# Patient Record
Sex: Female | Born: 1935 | Race: White | Hispanic: No | Marital: Married | Smoking: Never smoker
Health system: Southern US, Community
[De-identification: ages and names within clinical notes are randomized; demographics above are authoritative.]

## PROBLEM LIST (undated history)

## (undated) DIAGNOSIS — T17308A Unspecified foreign body in larynx causing other injury, initial encounter: Secondary | ICD-10-CM

## (undated) DIAGNOSIS — F319 Bipolar disorder, unspecified: Secondary | ICD-10-CM

## (undated) DIAGNOSIS — E669 Obesity, unspecified: Secondary | ICD-10-CM

## (undated) DIAGNOSIS — L0390 Cellulitis, unspecified: Secondary | ICD-10-CM

## (undated) DIAGNOSIS — R911 Solitary pulmonary nodule: Secondary | ICD-10-CM

## (undated) DIAGNOSIS — K219 Gastro-esophageal reflux disease without esophagitis: Secondary | ICD-10-CM

## (undated) DIAGNOSIS — E039 Hypothyroidism, unspecified: Secondary | ICD-10-CM

## (undated) DIAGNOSIS — F329 Major depressive disorder, single episode, unspecified: Secondary | ICD-10-CM

## (undated) DIAGNOSIS — M21962 Unspecified acquired deformity of left lower leg: Secondary | ICD-10-CM

## (undated) DIAGNOSIS — R251 Tremor, unspecified: Secondary | ICD-10-CM

## (undated) DIAGNOSIS — D649 Anemia, unspecified: Secondary | ICD-10-CM

## (undated) DIAGNOSIS — M21961 Unspecified acquired deformity of right lower leg: Secondary | ICD-10-CM

## (undated) DIAGNOSIS — I639 Cerebral infarction, unspecified: Secondary | ICD-10-CM

## (undated) DIAGNOSIS — R413 Other amnesia: Secondary | ICD-10-CM

## (undated) DIAGNOSIS — I890 Lymphedema, not elsewhere classified: Secondary | ICD-10-CM

## (undated) DIAGNOSIS — R41841 Cognitive communication deficit: Secondary | ICD-10-CM

## (undated) HISTORY — DX: Anemia, unspecified: D64.9

## (undated) HISTORY — DX: Tremor, unspecified: R25.1

## (undated) HISTORY — DX: Gastro-esophageal reflux disease without esophagitis: K21.9

## (undated) HISTORY — DX: Major depressive disorder, single episode, unspecified: F32.9

## (undated) HISTORY — PX: OTHER SURGICAL HISTORY: SHX169

## (undated) HISTORY — DX: Cellulitis, unspecified: L03.90

## (undated) HISTORY — DX: Cognitive communication deficit: R41.841

## (undated) HISTORY — DX: Obesity, unspecified: E66.9

## (undated) HISTORY — DX: Unspecified acquired deformity of right lower leg: M21.961

## (undated) HISTORY — DX: Other amnesia: R41.3

## (undated) HISTORY — DX: Hypothyroidism, unspecified: E03.9

## (undated) HISTORY — DX: Unspecified acquired deformity of left lower leg: M21.962

## (undated) HISTORY — DX: Cerebral infarction, unspecified: I63.9

## (undated) HISTORY — DX: Bipolar disorder, unspecified: F31.9

## (undated) HISTORY — DX: Solitary pulmonary nodule: R91.1

## (undated) HISTORY — DX: Lymphedema, not elsewhere classified: I89.0

---

## 2004-12-29 ENCOUNTER — Inpatient Hospital Stay (HOSPITAL_COMMUNITY): Admission: EM | Admit: 2004-12-29 | Discharge: 2004-12-31 | Payer: Self-pay | Admitting: Emergency Medicine

## 2005-05-19 ENCOUNTER — Emergency Department (HOSPITAL_COMMUNITY): Admission: EM | Admit: 2005-05-19 | Discharge: 2005-05-19 | Payer: Self-pay | Admitting: *Deleted

## 2005-05-26 ENCOUNTER — Emergency Department (HOSPITAL_COMMUNITY): Admission: EM | Admit: 2005-05-26 | Discharge: 2005-05-26 | Payer: Self-pay | Admitting: Emergency Medicine

## 2006-02-07 ENCOUNTER — Encounter (INDEPENDENT_AMBULATORY_CARE_PROVIDER_SITE_OTHER): Payer: Self-pay | Admitting: *Deleted

## 2006-02-07 ENCOUNTER — Ambulatory Visit (HOSPITAL_COMMUNITY): Admission: RE | Admit: 2006-02-07 | Discharge: 2006-02-07 | Payer: Self-pay | Admitting: Family Medicine

## 2006-07-19 ENCOUNTER — Ambulatory Visit: Payer: Self-pay | Admitting: Vascular Surgery

## 2006-11-24 ENCOUNTER — Emergency Department (HOSPITAL_COMMUNITY): Admission: EM | Admit: 2006-11-24 | Discharge: 2006-11-24 | Payer: Self-pay | Admitting: Emergency Medicine

## 2006-11-25 ENCOUNTER — Ambulatory Visit: Payer: Self-pay | Admitting: Internal Medicine

## 2006-11-25 ENCOUNTER — Ambulatory Visit: Payer: Self-pay | Admitting: Cardiovascular Disease

## 2006-11-25 ENCOUNTER — Inpatient Hospital Stay (HOSPITAL_COMMUNITY): Admission: EM | Admit: 2006-11-25 | Discharge: 2006-11-29 | Payer: Self-pay | Admitting: Emergency Medicine

## 2006-11-27 ENCOUNTER — Encounter (INDEPENDENT_AMBULATORY_CARE_PROVIDER_SITE_OTHER): Payer: Self-pay | Admitting: *Deleted

## 2006-11-29 ENCOUNTER — Encounter: Payer: Self-pay | Admitting: *Deleted

## 2006-12-16 ENCOUNTER — Ambulatory Visit: Payer: Self-pay | Admitting: Internal Medicine

## 2007-02-12 ENCOUNTER — Encounter: Admission: RE | Admit: 2007-02-12 | Discharge: 2007-03-12 | Payer: Self-pay | Admitting: Family Medicine

## 2007-03-13 ENCOUNTER — Encounter: Admission: RE | Admit: 2007-03-13 | Discharge: 2007-04-10 | Payer: Self-pay | Admitting: Family Medicine

## 2007-04-03 DIAGNOSIS — K219 Gastro-esophageal reflux disease without esophagitis: Secondary | ICD-10-CM

## 2007-04-04 ENCOUNTER — Ambulatory Visit: Payer: Self-pay | Admitting: Internal Medicine

## 2007-04-04 DIAGNOSIS — J984 Other disorders of lung: Secondary | ICD-10-CM | POA: Insufficient documentation

## 2007-04-15 ENCOUNTER — Telehealth (INDEPENDENT_AMBULATORY_CARE_PROVIDER_SITE_OTHER): Payer: Self-pay | Admitting: *Deleted

## 2007-07-13 ENCOUNTER — Emergency Department (HOSPITAL_COMMUNITY): Admission: EM | Admit: 2007-07-13 | Discharge: 2007-07-13 | Payer: Self-pay | Admitting: Emergency Medicine

## 2008-01-21 ENCOUNTER — Encounter (INDEPENDENT_AMBULATORY_CARE_PROVIDER_SITE_OTHER): Payer: Self-pay | Admitting: Obstetrics and Gynecology

## 2008-01-21 ENCOUNTER — Ambulatory Visit (HOSPITAL_COMMUNITY): Admission: RE | Admit: 2008-01-21 | Discharge: 2008-01-21 | Payer: Self-pay | Admitting: Obstetrics and Gynecology

## 2008-05-17 ENCOUNTER — Ambulatory Visit: Payer: Self-pay | Admitting: Family Medicine

## 2008-05-17 DIAGNOSIS — R32 Unspecified urinary incontinence: Secondary | ICD-10-CM | POA: Insufficient documentation

## 2008-06-04 ENCOUNTER — Ambulatory Visit: Payer: Self-pay | Admitting: Family Medicine

## 2008-06-04 DIAGNOSIS — I87309 Chronic venous hypertension (idiopathic) without complications of unspecified lower extremity: Secondary | ICD-10-CM | POA: Insufficient documentation

## 2008-06-25 ENCOUNTER — Ambulatory Visit: Payer: Self-pay | Admitting: Family Medicine

## 2008-07-19 ENCOUNTER — Ambulatory Visit: Payer: Self-pay | Admitting: Vascular Surgery

## 2008-08-23 IMAGING — CT NM PET TUM IMG SKULL BASE T - THIGH
6 series · 25 of 25 positions shown · IV contrast ([ID])
Comparison: CT dated 11/25/2006

CLINICAL DATA: Lung mass

FDG PET-CT TUMOR IMAGING (SKULL BASE TO THIGHS):
Fasting Blood Glucose:  114
TECHNIQUE: 15.7 mCi F-18 FDG was injected intravenously via the right wrist . 
Full-ring PET imaging was performed from the skull base through the mid-thighs
64 minutes after injection.  CT data was obtained and used for attenuation
correction and anatomic localization only.  (This was not acquired as a
diagnostic CT examination.)

[Series 1: pet ac · axial · 3.3mm · 4.69mm/px · z∈[-870,+0]mm · 5 of 267 slices shown]
[im 1/267]
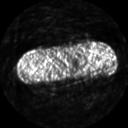
[im 67/267]
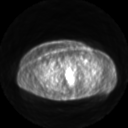
[im 134/267]
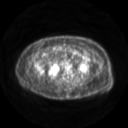
[im 200/267]
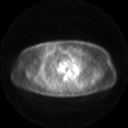
[im 267/267]
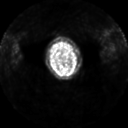

[Series 2: pet nac · axial · 3.3mm · 4.69mm/px · z∈[-870,+0]mm · 6 of 267 slices shown]
[im 1/267]
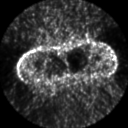
[im 54/267]
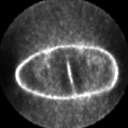
[im 107/267]
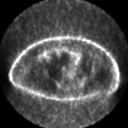
[im 160/267]
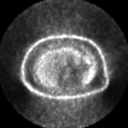
[im 213/267]
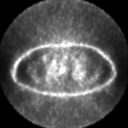
[im 267/267]
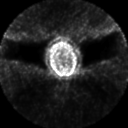

[Series 2: ct images · axial · 3.8mm · 0.98mm/px · z∈[-870,+0]mm · 6 of 263 slices shown]
[im 1/263]
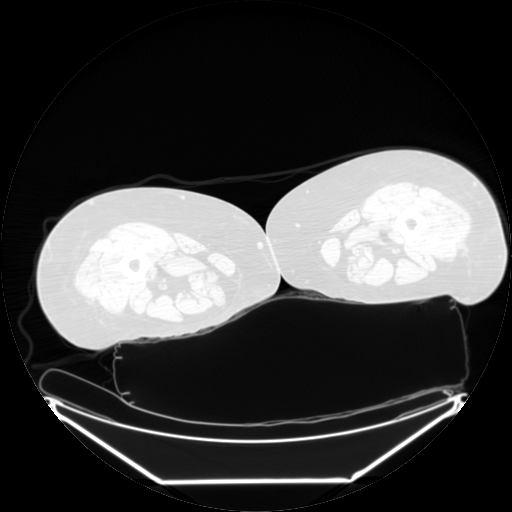
[im 53/263]
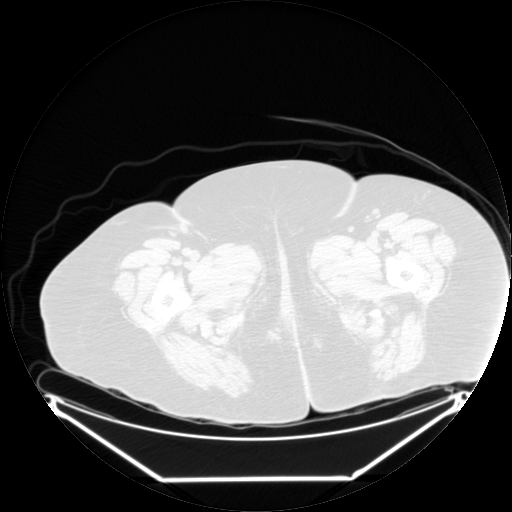
[im 105/263]
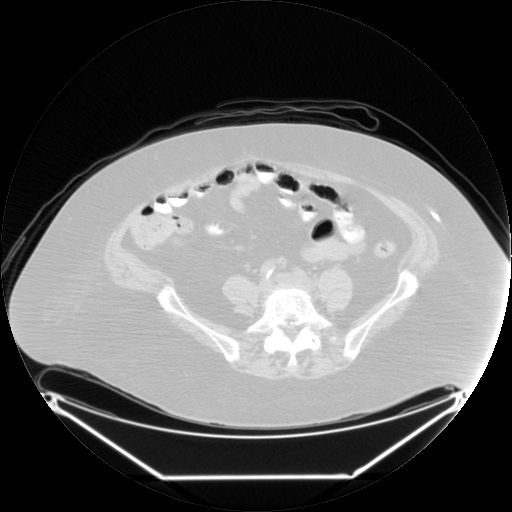
[im 158/263]
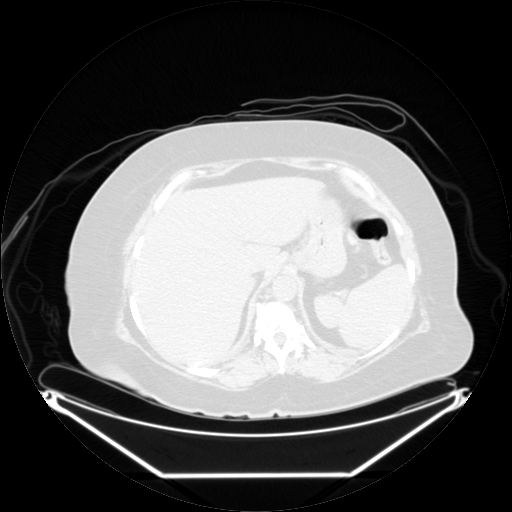
[im 210/263]
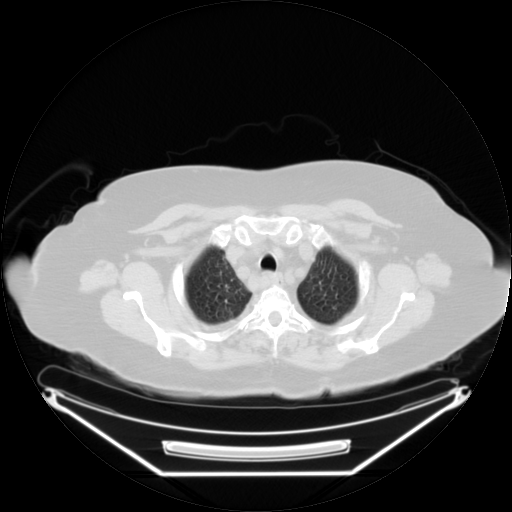
[im 263/263  brain]
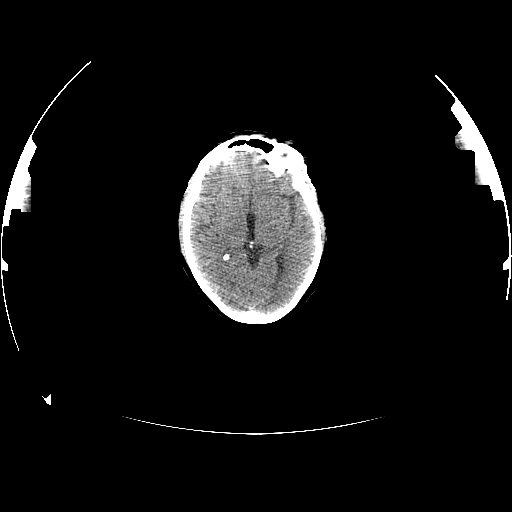

[Series 123: mip · coronal · 3.3mm · 4.69mm/px · 1 of 30 slices shown]
[im 1/30]
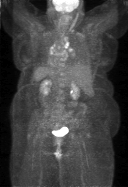

[Series 151: reformatted · axial · 3.3mm · 3.91mm/px · z∈[-870,+0]mm · 6 of 265 slices shown (1 of 2)]
[im 1/265]
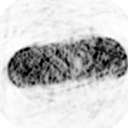
[im 53/265]
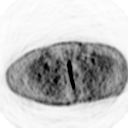
[im 106/265]
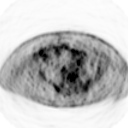
[im 159/265]
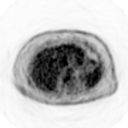
[im 212/265]
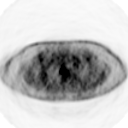
[im 265/265]
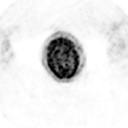

[Series 153: reformatted · coronal · 4.7mm · 6.98mm/px · 1 of 69 slices shown (2 of 2)]
[im 1/69]
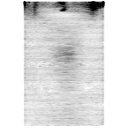

[25 of 25 positions shown; findings below may reference images not displayed]

FINDINGS: There is no hypermetabolic lymph nodes within the soft tissues of the
neck.

Negative for hypermetabolic axillary, lymph nodes.

Right supraclavicular lymph node measures 10 mm and exhibits increased uptake
with an SUV max equal to 5.6, image 44.

Enlarged hypermetabolic bilateral hilar and mediastinal lymph nodes are
identified.  Many of these lymph nodes are partially calcified may reflect prior
granulomatous inflammation or infection.  For reference purposes left hilar
lymph node has an SUV max equal to 9.1.

Right paratracheal lymph node has an SUV max equaled 14.0.

Bilateral pleural effusions, right greater than left.

Increased interstitial markings are noted consistent with a pulmonary edema.

Within the right middle lobe there is a spiculated nodule which measures 8.9 mm.
This is too small to reliably characterized by PET. The SUV max within this
nodule measures 1.5. This degree of uptake is considered nonspecific for tumor.
However, given the small size of this nodule and ischia the max of 1.5 is
somewhat concerning for malignancy. 

There is no abnormal FDG uptake within the liver parenchyma.

Gallstones are identified.

Both adrenal glands are negative.

Pancreas is negative.

There is no abnormal uptake within the kidneys.

There is no hypermetabolic retroperitoneal, or small bowel mesenteric lymph
nodes.

No hypermetabolic pelvic, or inguinal lymph nodes. 

Review of the visualized axial and appendicular structures shows no evidence for
hypermetabolic bone metastases.
IMPRESSION: 1. Again seen is a spiculated nodule within the right lower lobe. By transaxial
imaging this measures 8.9 mm which, is too small to reliably characterized by
PET. Mild low-level nonspecific uptake within this nodule is noted however. 
This nodule should be managed in one of 2 ways: 1.Proceed with percutaneous
biopsy. 2.  Alternatively,  a followup examination and 3 months should be
obtained.
2.  Enlarged mediastinal and hilar lymph nodes exhibit intense FDG uptake. Some
of these lymph nodes are partially calcified which may reflect prior
granulomatous inflammation or infection. Granulomatous disease may exhibit
abnormal increased uptake on PET imaging. Given the nonspecific results of this
exam, percutaneous biopsy of these lymph nodes would be the most specific way to
evaluate for the presence of malignancy.
3. No evidence for hypermetabolic tumor within the abdomen or pelvis.

## 2008-09-01 ENCOUNTER — Emergency Department (HOSPITAL_COMMUNITY): Admission: EM | Admit: 2008-09-01 | Discharge: 2008-09-01 | Payer: Self-pay | Admitting: Emergency Medicine

## 2009-11-10 ENCOUNTER — Ambulatory Visit: Payer: Self-pay | Admitting: Family Medicine

## 2009-11-10 DIAGNOSIS — J309 Allergic rhinitis, unspecified: Secondary | ICD-10-CM | POA: Insufficient documentation

## 2009-11-10 DIAGNOSIS — R3915 Urgency of urination: Secondary | ICD-10-CM | POA: Insufficient documentation

## 2009-11-10 LAB — CONVERTED CEMR LAB: Blood Glucose, Fingerstick: 117

## 2009-12-07 ENCOUNTER — Telehealth: Payer: Self-pay | Admitting: Family Medicine

## 2009-12-14 ENCOUNTER — Telehealth: Payer: Self-pay | Admitting: Family Medicine

## 2009-12-16 ENCOUNTER — Ambulatory Visit: Payer: Self-pay | Admitting: Family Medicine

## 2009-12-16 DIAGNOSIS — M25579 Pain in unspecified ankle and joints of unspecified foot: Secondary | ICD-10-CM | POA: Insufficient documentation

## 2009-12-19 ENCOUNTER — Telehealth (INDEPENDENT_AMBULATORY_CARE_PROVIDER_SITE_OTHER): Payer: Self-pay | Admitting: *Deleted

## 2009-12-19 ENCOUNTER — Ambulatory Visit: Payer: Self-pay | Admitting: Family Medicine

## 2009-12-19 LAB — CONVERTED CEMR LAB
Basophils Absolute: 0.1 10*3/uL (ref 0.0–0.1)
Basophils Relative: 0.8 % (ref 0.0–3.0)
Eosinophils Absolute: 0.3 10*3/uL (ref 0.0–0.7)
Eosinophils Relative: 4 % (ref 0.0–5.0)
HCT: 31.4 % — ABNORMAL LOW (ref 36.0–46.0)
Hemoglobin: 10.2 g/dL — ABNORMAL LOW (ref 12.0–15.0)
Lymphocytes Relative: 19.4 % (ref 12.0–46.0)
Lymphs Abs: 1.5 10*3/uL (ref 0.7–4.0)
MCHC: 32.7 g/dL (ref 30.0–36.0)
MCV: 88.8 fL (ref 78.0–100.0)
Monocytes Absolute: 0.6 10*3/uL (ref 0.1–1.0)
Monocytes Relative: 7.1 % (ref 3.0–12.0)
Neutro Abs: 5.4 10*3/uL (ref 1.4–7.7)
Neutrophils Relative %: 68.7 % (ref 43.0–77.0)
Platelets: 299 10*3/uL (ref 150.0–400.0)
RBC: 3.53 M/uL — ABNORMAL LOW (ref 3.87–5.11)
RDW: 14.6 % (ref 11.5–14.6)
Sed Rate: 61 mm/hr — ABNORMAL HIGH (ref 0–22)
Uric Acid, Serum: 8.8 mg/dL — ABNORMAL HIGH (ref 2.4–7.0)
WBC: 7.8 10*3/uL (ref 4.5–10.5)

## 2010-01-06 ENCOUNTER — Ambulatory Visit: Payer: Self-pay | Admitting: Family Medicine

## 2010-01-06 DIAGNOSIS — R609 Edema, unspecified: Secondary | ICD-10-CM

## 2010-01-09 ENCOUNTER — Telehealth: Payer: Self-pay | Admitting: Family Medicine

## 2010-01-09 LAB — CONVERTED CEMR LAB
BUN: 18 mg/dL (ref 6–23)
CO2: 26 meq/L (ref 19–32)
Calcium: 9.2 mg/dL (ref 8.4–10.5)
Chloride: 105 meq/L (ref 96–112)
Creatinine, Ser: 1.03 mg/dL (ref 0.40–1.20)
Glucose, Bld: 73 mg/dL (ref 70–99)
Potassium: 4.2 meq/L (ref 3.5–5.3)
Pro B Natriuretic peptide (BNP): 20.8 pg/mL (ref 0.0–100.0)
Sodium: 140 meq/L (ref 135–145)

## 2010-01-13 ENCOUNTER — Ambulatory Visit: Payer: Self-pay | Admitting: Family Medicine

## 2010-01-18 ENCOUNTER — Ambulatory Visit: Payer: Self-pay | Admitting: Cardiology

## 2010-01-18 ENCOUNTER — Encounter: Payer: Self-pay | Admitting: Family Medicine

## 2010-01-18 ENCOUNTER — Ambulatory Visit: Payer: Self-pay

## 2010-01-18 ENCOUNTER — Ambulatory Visit (HOSPITAL_COMMUNITY): Admission: RE | Admit: 2010-01-18 | Discharge: 2010-01-18 | Payer: Self-pay | Admitting: Family Medicine

## 2010-02-14 ENCOUNTER — Telehealth: Payer: Self-pay | Admitting: Family Medicine

## 2010-02-17 ENCOUNTER — Ambulatory Visit: Payer: Self-pay | Admitting: Family Medicine

## 2010-02-17 ENCOUNTER — Encounter: Payer: Self-pay | Admitting: Family Medicine

## 2010-02-17 DIAGNOSIS — E039 Hypothyroidism, unspecified: Secondary | ICD-10-CM | POA: Insufficient documentation

## 2010-02-17 LAB — CONVERTED CEMR LAB: TSH: 0.49 microintl units/mL (ref 0.35–5.50)

## 2010-02-20 ENCOUNTER — Telehealth: Payer: Self-pay | Admitting: Family Medicine

## 2010-02-20 ENCOUNTER — Encounter: Payer: Self-pay | Admitting: Family Medicine

## 2010-02-20 ENCOUNTER — Telehealth (INDEPENDENT_AMBULATORY_CARE_PROVIDER_SITE_OTHER): Payer: Self-pay | Admitting: *Deleted

## 2010-02-22 ENCOUNTER — Ambulatory Visit: Payer: Self-pay | Admitting: Critical Care Medicine

## 2010-02-23 ENCOUNTER — Ambulatory Visit: Payer: Self-pay | Admitting: Internal Medicine

## 2010-02-27 ENCOUNTER — Ambulatory Visit: Payer: Self-pay | Admitting: Critical Care Medicine

## 2010-02-28 ENCOUNTER — Encounter: Payer: Self-pay | Admitting: Critical Care Medicine

## 2010-03-01 ENCOUNTER — Telehealth: Payer: Self-pay | Admitting: Critical Care Medicine

## 2010-03-01 ENCOUNTER — Encounter: Payer: Self-pay | Admitting: Critical Care Medicine

## 2010-03-02 ENCOUNTER — Ambulatory Visit: Payer: Self-pay | Admitting: Family Medicine

## 2010-03-08 ENCOUNTER — Telehealth: Payer: Self-pay | Admitting: Critical Care Medicine

## 2010-03-08 ENCOUNTER — Encounter: Payer: Self-pay | Admitting: Critical Care Medicine

## 2010-03-09 ENCOUNTER — Telehealth: Payer: Self-pay | Admitting: Family Medicine

## 2010-03-27 ENCOUNTER — Encounter: Payer: Self-pay | Admitting: Critical Care Medicine

## 2010-03-31 ENCOUNTER — Ambulatory Visit
Admission: RE | Admit: 2010-03-31 | Discharge: 2010-03-31 | Payer: Self-pay | Source: Home / Self Care | Attending: Critical Care Medicine | Admitting: Critical Care Medicine

## 2010-03-31 ENCOUNTER — Encounter: Payer: Self-pay | Admitting: Family Medicine

## 2010-03-31 DIAGNOSIS — J449 Chronic obstructive pulmonary disease, unspecified: Secondary | ICD-10-CM | POA: Insufficient documentation

## 2010-04-11 NOTE — Progress Notes (Signed)
Summary: call request re: labs  Phone Note Call from Patient Call back at Home Phone [phone]   Caller: vm Call For: Dr. Senaida Lange nurse Summary of Call: Called pt home as requested, left another message. Called again & man said his wife has already talked to [place].  Rudy Jew, RN  January 09, 2010 1:48 PM  Initial call taken by: Rudy Jew, RN,  January 09, 2010 11:32 AM

## 2010-04-11 NOTE — Progress Notes (Signed)
Summary: rtc  Phone Note Call from Patient Call back at Home Phone [phone]   Caller: Patient Call For: Evelena Peat MD Summary of Call: pt is returning christy call Initial call taken by: Heron Sabins,  December 19, 2009 11:00 AM  Follow-up for Phone Call        Spoke with pt Follow-up by: Josph Macho RMA,  December 19, 2009 11:34 AM

## 2010-04-11 NOTE — Assessment & Plan Note (Signed)
Summary: post nasal drip/med for feminine issue/cjr   Vital Signs:  Patient profile:   76 year old female Weight:      199 pounds Temp:     97.6 degrees F oral BP sitting:   130 / 70  (left arm) Cuff size:   large  Vitals Entered By: Sid Falcon LPN (November 10, 2009 10:04 AM) CBG Result 117   History of Present Illness:  Seen for the following items  She relates several months of postnasal drip. Clear mucus. Has seen allergist previously and had testing apparently no clear allergens detected. No clear triggers. Denies any sneezing. Occasional symptoms of watery discharge and itch. Has tried antihistamines including Allegra and Zyrtec without relief no purulent discharge. No significant cough  Patient also complains of urine frequency. She has some severe urgency which has been treated in the past with VESIcare but apparently took herself off this medication. No significant side effects. She comes in today specifically requesting trial of Toviaz.  She has some chronic dry mouth related to lithium therapy. She has occasional nocturia. Does drink some caffeine in terms of sodas but no other sources of caffeine.  Allergies: 1)  ! Macrobid (Nitrofurantoin Monohyd Macro) 2)  ! Amoxicillin (Amoxicillin) 3)  ! Zegerid (Omeprazole-Sodium Bicarbonate) 4)  ! Sulfasalazine (Sulfasalazine)  Past History:  Past Medical History: Last updated: 05/17/2008 G E R D Hypothroidism  bipolar disorder obesity  hx of vericose veins Anemia hx of D&C pulmonary nodule/sarcoidosis Urinary urge incontinence  Current Meds:  * LITHIUM CARBONATE 300 MG/450 MG  TBCR (LITHIUM CARBONATE) as directed EFFEXOR 75 MG  TABS (VENLAFAXINE HCL) 2 tabs every day BUDEPRION SR 150 MG  TB12 (BUPROPION HCL) once daily LEVOXYL 75 MCG  TABS (LEVOTHYROXINE SODIUM) once daily EQL OMEPRAZOLE 20 MG  TBEC (OMEPRAZOLE) once daily ADULT ASPIRIN EC LOW STRENGTH 81 MG  TBEC (ASPIRIN) once daily FERROUS SULFATE 324 MG   TBEC (FERROUS SULFATE) once daily ANTI-OXIDANT   TABS (MULTIPLE VITAMIN) once daily IBUPROFEN COLD/SINUS 30-200 MG  TABS (PSEUDOEPHEDRINE-IBUPROFEN) once daily MELOXICAM 7.5 MG  TABS (MELOXICAM) once daily ZYRTEC ALLERGY 10 MG  TABS (CETIRIZINE HCL) as needed    Social History: Last updated: 05/17/2008 Patient never smoked.  marrried retired PMH reviewed for relevance, SH/Risk Factors reviewed for relevance  Review of Systems  The patient denies anorexia, fever, weight loss, hoarseness, chest pain, syncope, prolonged cough, abdominal pain, and muscle weakness.    Physical Exam  General:  Well-developed,well-nourished,in no acute distress; alert,appropriate and cooperative throughout examination Eyes:  pupils equal, pupils round, and pupils reactive to light.   Ears:  External ear exam shows no significant lesions or deformities.  Otoscopic examination reveals clear canals, tympanic membranes are intact bilaterally without bulging, retraction, inflammation or discharge. Hearing is grossly normal bilaterally. Nose:  External nasal examination shows no deformity or inflammation. Nasal mucosa are pink and moist without lesions or exudates. Mouth:  mouth is slightly dry otherwise clear Neck:  No deformities, masses, or tenderness noted. Lungs:  Normal respiratory effort, chest expands symmetrically. Lungs are clear to auscultation, no crackles or wheezes. Heart:  regular rhythm and rate with 2-3/6 systolic murmur right upper sternum border Extremities:  she has moderate edema bilaterally but nonpitting lower legs and feet Psych:  normally interactive, good eye contact, not anxious appearing, and not depressed appearing.     Impression & Recommendations:  Problem # 1:  URGENCY OF URINATION (KXF-818.29) Assessment Deteriorated trial of Toviaz for urine urge incontinence. Glucose  normal.  Problem # 2:  ALLERGIC RHINITIS (ICD-477.9) Assessment: New start Flonase. Her updated  medication list for this problem includes:    Flonase 50 Mcg/act Susp (Fluticasone propionate) .Marland Kitchen... 2 puffs at hs  Complete Medication List: 1)  Lithium Carbonate 300 Mg/450 Mg Tbcr (lithium Carbonate)  .... As directed 2)  Effexor 75 Mg Tabs (Venlafaxine hcl) .... 2 tabs every day 3)  Budeprion Sr 150 Mg Tb12 (Bupropion hcl) .... Once daily 4)  Levoxyl 75 Mcg Tabs (Levothyroxine sodium) .... Once daily 5)  Eql Omeprazole 20 Mg Tbec (Omeprazole) .... Once daily 6)  Adult Aspirin Ec Low Strength 81 Mg Tbec (Aspirin) .... Once daily 7)  Ferrous Sulfate 324 Mg Tbec (Ferrous sulfate) .... Once daily 8)  Anti-oxidant Tabs (Multiple vitamin) .... Once daily 9)  Meloxicam 7.5 Mg Tabs (Meloxicam) .... Once daily 10)  Toviaz 4 Mg Xr24h-tab (Fesoterodine fumarate) .... One by mouth once daily 11)  Flonase 50 Mcg/act Susp (Fluticasone propionate) .... 2 puffs at hs 12)  Aricept 23 Mg Tabs (Donepezil hcl) .... Once daily 13)  Ambien Cr 6.25 Mg Cr-tabs (Zolpidem tartrate) .... Once daily at bedtime 14)  Zyprexa 5 Mg Tabs (Olanzapine) .... Once daily at bedtime 15)  Vitamin D 2000 Unit Tabs (Cholecalciferol) .... Once daily  Other Orders: Capillary Blood Glucose/CBG (60454)  Patient Instructions: 1)  Toviaz 4mg  take one tablet daily. 2)  Reduce caffeine use. 3)  Schedule follow up in 3 weeks. Prescriptions: FLONASE 50 MCG/ACT SUSP (FLUTICASONE PROPIONATE) 2 puffs at HS  #1 x 6   Entered and Authorized by:   Evelena Peat MD   Signed by:   Evelena Peat MD on 11/10/2009   Method used:   Print then Give to Patient   RxID:   [id number]   Preventive Care Screening  Last Pneumovax:    Date:  03/12/2008    Results:  given     Immunization History:  Zostavax History:    Zostavax # 1:  zostavax (03/12/2008)

## 2010-04-11 NOTE — Assessment & Plan Note (Signed)
Summary: fup/discuss labs/cjr   Vital Signs:  Patient profile:   76 year old female Weight:      198 pounds Temp:     98.8 degrees F oral BP sitting:   140 / 82  (left arm)  Vitals Entered By: Sid Falcon LPN (January 06, 2010 4:27 PM)  History of Present Illness: Patient seen for the following several issues.  Recent ankle and foot pain. Presumed gout. Labs significant for elevated sedimentation rate, uric acid 8.8. Patient has chronic anemia with hemoglobin 10.2 normal MCV. Patient prescribed allopurinol which she only took for a couple of days. By morning her husband had difficulty waking her and they attributed this to the allopurinol.  she found some other type of medication for acute relief of gout and her symptoms have resolved at this time.  At this time, she is unsure what that med was.  Other issue is perennial allergies. Using Flonase without improvement. Would like to explore other options. Mostly clear nasal discharge. History of urine urge incontinence. Uses Toviax which works well. Needs refills. No recent burning with urination.  history of some progressive dyspnea mostly with activity. No associated chest pain. She has some chronic lower extremity edema which is essentially unchanged. No orthopnea. No history of smoking. No chronic cough.  She thinks dyspnea symptoms progressive for over one year.  No pleuritic pain. Hx morbid obesity and gets very little activity at baseline.  Bipolar followed by psychiatry.  Allergies: 1)  ! Macrobid (Nitrofurantoin Monohyd Macro) 2)  ! Amoxicillin (Amoxicillin) 3)  ! Zegerid (Omeprazole-Sodium Bicarbonate) 4)  ! Sulfasalazine (Sulfasalazine)  Past History:  Past Medical History: Last updated: 05/17/2008 G E R D Hypothroidism  bipolar disorder obesity  hx of vericose veins Anemia hx of D&C pulmonary nodule/sarcoidosis Urinary urge incontinence  Current Meds:  * LITHIUM CARBONATE 300 MG/450 MG  TBCR (LITHIUM  CARBONATE) as directed EFFEXOR 75 MG  TABS (VENLAFAXINE HCL) 2 tabs every day BUDEPRION SR 150 MG  TB12 (BUPROPION HCL) once daily LEVOXYL 75 MCG  TABS (LEVOTHYROXINE SODIUM) once daily EQL OMEPRAZOLE 20 MG  TBEC (OMEPRAZOLE) once daily ADULT ASPIRIN EC LOW STRENGTH 81 MG  TBEC (ASPIRIN) once daily FERROUS SULFATE 324 MG  TBEC (FERROUS SULFATE) once daily ANTI-OXIDANT   TABS (MULTIPLE VITAMIN) once daily IBUPROFEN COLD/SINUS 30-200 MG  TABS (PSEUDOEPHEDRINE-IBUPROFEN) once daily MELOXICAM 7.5 MG  TABS (MELOXICAM) once daily ZYRTEC ALLERGY 10 MG  TABS (CETIRIZINE HCL) as needed    Family History: Last updated: 04/04/2007 no lung dz or cancer  Social History: Last updated: 05/17/2008 Patient never smoked.  marrried retired  Risk Factors: Smoking Status: never (04/04/2007) PMH-FH-SH reviewed for relevance  Review of Systems       The patient complains of dyspnea on exertion and peripheral edema.  The patient denies anorexia, fever, weight loss, vision loss, chest pain, syncope, prolonged cough, headaches, hemoptysis, abdominal pain, melena, hematochezia, severe indigestion/heartburn, muscle weakness, transient blindness, and depression.    Physical Exam  General:  alert, appropriate dress, cooperative to examination, and overweight-appearing.   Mouth:  Oral mucosa and oropharynx without lesions or exudates.  Teeth in good repair. Neck:  No deformities, masses, or tenderness noted. Lungs:  Normal respiratory effort, chest expands symmetrically. Lungs are clear to auscultation, no crackles or wheezes. Heart:  normal rate and regular rhythm.   Extremities:  she has 1 plus edema feet, ankles, and legs bilaterally. Neurologic:  alert & oriented X3, cranial nerves II-XII intact, and strength normal in  all extremities.   Skin:  no rashes.   Cervical Nodes:  No lymphadenopathy noted Psych:  normally interactive, good eye contact, not depressed appearing, and slightly anxious.      Impression & Recommendations:  Problem # 1:  ANKLE PAIN, LEFT (ICD-719.47) Assessment Improved ?gout. Currently stable.  We have decided against Allopurinol at this time since last ?episode almost 2 years ago and esp don't want to initiate in setting of acute flare.  Problem # 2:  DYSPNEA (ICD-786.09) ?deconditioning but pt thinks symptoms progressive in recent months.  CXR, BNP, ECHO. Orders: T-2 View CXR (71020TC) TLB-BNP (B-Natriuretic Peptide) (83880-BNPR) Echo Referral (Echo) Venipuncture (16109) Specimen Handling (60454)  Problem # 3:  URGENCY OF URINATION (UJW-119.14)  refiled toviaz.  Orders: Prescription Created Electronically [phone])  Problem # 4:  ALLERGIC RHINITIS (ICD-477.9) stop Flonase and trial of Astelin. Her updated medication list for this problem includes:    Astelin 137 Mcg/spray Soln (Azelastine hcl) .Marland Kitchen... 1-2 sprays per nostril two times a day  Complete Medication List: 1)  Lithium Carbonate 300 Mg/450 Mg Tbcr (lithium Carbonate)  .... As directed 2)  Effexor 75 Mg Tabs (Venlafaxine hcl) .... 2 tabs every day 3)  Budeprion Sr 150 Mg Tb12 (Bupropion hcl) .... Once daily 4)  Levoxyl 75 Mcg Tabs (Levothyroxine sodium) .... Once daily 5)  Eql Omeprazole 20 Mg Tbec (Omeprazole) .... Once daily 6)  Adult Aspirin Ec Low Strength 81 Mg Tbec (Aspirin) .... Once daily 7)  Ferrous Sulfate 324 Mg Tbec (Ferrous sulfate) .... Once daily 8)  Anti-oxidant Tabs (Multiple vitamin) .... Once daily 9)  Toviaz 4 Mg Xr24h-tab (Fesoterodine fumarate) .... One by mouth once daily 10)  Astelin 137 Mcg/spray Soln (Azelastine hcl) .Marland Kitchen.. 1-2 sprays per nostril two times a day 11)  Aricept 23 Mg Tabs (Donepezil hcl) .... Once daily 12)  Ambien Cr 6.25 Mg Cr-tabs (Zolpidem tartrate) .... Once daily at bedtime 13)  Zyprexa 5 Mg Tabs (Olanzapine) .... Once daily at bedtime 14)  Vitamin D 2000 Unit Tabs (Cholecalciferol) .... Once daily 15)  Tramadol Hcl 50 Mg Tabs (Tramadol hcl)  .Marland Kitchen.. 1 tab by mouth three times a day as needed pain 16)  Allopurinol 100 Mg Tabs (Allopurinol) .... Once daily  Other Orders: TLB-BMP (Basic Metabolic Panel-BMET) (80048-METABOL)  Patient Instructions: 1)  Please schedule a follow-up appointment in 1 month.  Prescriptions: TOVIAZ 4 MG XR24H-TAB (FESOTERODINE FUMARATE) one by mouth once daily  #90 x 3   Entered and Authorized by:   Evelena Peat MD   Signed by:   Evelena Peat MD on 01/06/2010   Method used:   Faxed to ...       [facility] (mail-order)             , [place]         Ph: [phone]       Fax: [phone]   RxID:   [id number] ASTELIN 137 MCG/SPRAY SOLN (AZELASTINE HCL) 1-2 sprays per nostril two times a day  #1 x 5   Entered and Authorized by:   Evelena Peat MD   Signed by:   Evelena Peat MD on 01/06/2010   Method used:   Electronically to        [facility]. [phone]* (retail)       [id number]       [place], [place]  [address]       Ph: [phone]  Fax: [phone]   RxID:   [id number]    Orders Added: 1)  T-2 View CXR [71020TC] 2)  TLB-BNP (B-Natriuretic Peptide) [83880-BNPR] 3)  Echo Referral [Echo] 4)  TLB-BMP (Basic Metabolic Panel-BMET) [80048-METABOL] 5)  Venipuncture [75643] 6)  Specimen Handling [99000] 7)  Est. Patient Level IV [32951] 8)  Prescription Created Electronically [phone]

## 2010-04-11 NOTE — Progress Notes (Signed)
Summary: cancelled ov less than 24hrs  Phone Note Call from Patient Call back at [facility] Phone [phone]   Caller: Patient Call For: Evelena Peat MD Summary of Call: pt had ov today cancelled  due to sickness. Initial call taken by: Heron Sabins,  December 07, 2009 8:31 AM  Follow-up for Phone Call        noted Follow-up by: Evelena Peat MD,  December 07, 2009 8:48 AM

## 2010-04-11 NOTE — Progress Notes (Signed)
Summary: less than 24 hrs rsc  Phone Note Call from Patient Call back at [facility] Phone [phone]   Caller: Patient Summary of Call: pt rsc less than 24 hrs  due to afraid to drive. Pt will have husband drive her to [facility] 11[facility]17[facility]1189  Initial call taken by: Heron Sabins,  December 14, 2009 10:17 AM  Follow-up for Phone Call        What would Dr Caryl Never usually do? Follow-up by: Josph Macho RMA,  December 14, 2009 10:26 AM  Additional Follow-up for Phone Call Additional follow up Details #1::        As long as they called, he would not charge.   Sid Falcon LPN  December 14, 2009 12:01 PM     Additional Follow-up for Phone Call Additional follow up Details #2::    Dr Abner Greenspan wants to follow how Dr Caryl Never would handle his patients. No charge Follow-up by: Josph Macho RMA,  December 14, 2009 12:04 PM  Additional Follow-up for Phone Call Additional follow up Details #3:: Details for Additional Follow-up Action Taken: ok pt is sch for 12-16-2009 Additional Follow-up by: Heron Sabins,  December 15, 2009 4:06 PM

## 2010-04-11 NOTE — Assessment & Plan Note (Signed)
Summary: SWOLLEN ANKLES/PAINFUL/CJR pt rsc due to afraid to drive/njr   Vital Signs:  Patient profile:   76 year old female Height:      217 inches (551.18 cm) Weight:      198 pounds (90.00 kg) BMI:     2.97 O2 Sat:      82 % on Room air Temp:     98.4 degrees F (36.89 degrees C) oral Pulse rate:   82 / minute BP sitting:   136 / 90  (left arm) Cuff size:   large  Vitals Entered By: Josph Macho RMA (December 16, 2009 1:51 PM)  O2 Flow:  Room air CC: Swollen ankles- painful X 2 months/ Flu shot today/ CF Is Patient Diabetic? No   History of Present Illness: Patient in today for evaluation b/l ankle pain worse in the left ankle. She denies any recent trauma and acknowledges some trouble off and on with ankle pain for many years. However over the past 1-2 weeks her left ankle has been increasingly painful. Not red or hot although she believes she has had trouble with gout in the past. She feels her ankle is turning outward more and  making her limp. She denies calf pain/CP/palp/f/c/GI or GU c/o. She is noting progressively worsening DOE but that is over the course of years not weeks.  Current Medications (verified): 1)  Lithium Carbonate 300 Mg/450 Mg  Tbcr (Lithium Carbonate) .... As Directed 2)  Effexor 75 Mg  Tabs (Venlafaxine Hcl) .... 2 Tabs Every Day 3)  Budeprion Sr 150 Mg  Tb12 (Bupropion Hcl) .... Once Daily 4)  Levoxyl 75 Mcg  Tabs (Levothyroxine Sodium) .... Once Daily 5)  Eql Omeprazole 20 Mg  Tbec (Omeprazole) .... Once Daily 6)  Adult Aspirin Ec Low Strength 81 Mg  Tbec (Aspirin) .... Once Daily 7)  Ferrous Sulfate 324 Mg  Tbec (Ferrous Sulfate) .... Once Daily 8)  Anti-Oxidant   Tabs (Multiple Vitamin) .... Once Daily 9)  Toviaz 4 Mg Xr24h-Tab (Fesoterodine Fumarate) .... One By Mouth Once Daily 10)  Flonase 50 Mcg/act Susp (Fluticasone Propionate) .... 2 Puffs At [facility] 11)  Aricept 23 Mg Tabs (Donepezil Hcl) .... Once Daily 12)  Ambien Cr 6.25 Mg Cr-Tabs (Zolpidem  Tartrate) .... Once Daily At Bedtime 13)  Zyprexa 5 Mg Tabs (Olanzapine) .... Once Daily At Bedtime 14)  Vitamin D 2000 Unit Tabs (Cholecalciferol) .... Once Daily  Allergies (verified): 1)  ! Macrobid (Nitrofurantoin Monohyd Macro) 2)  ! Amoxicillin (Amoxicillin) 3)  ! Zegerid (Omeprazole-Sodium Bicarbonate) 4)  ! Sulfasalazine (Sulfasalazine)  Past History:  Past medical history reviewed for relevance to current acute and chronic problems. Social history (including risk factors) reviewed for relevance to current acute and chronic problems.  Past Medical History: Reviewed history from 05/17/2008 and no changes required. G E R D Hypothroidism  bipolar disorder obesity  hx of vericose veins Anemia hx of D&C pulmonary nodule/sarcoidosis Urinary urge incontinence  Current Meds:  * LITHIUM CARBONATE 300 MG/450 MG  TBCR (LITHIUM CARBONATE) as directed EFFEXOR 75 MG  TABS (VENLAFAXINE HCL) 2 tabs every day BUDEPRION SR 150 MG  TB12 (BUPROPION HCL) once daily LEVOXYL 75 MCG  TABS (LEVOTHYROXINE SODIUM) once daily EQL OMEPRAZOLE 20 MG  TBEC (OMEPRAZOLE) once daily ADULT ASPIRIN EC LOW STRENGTH 81 MG  TBEC (ASPIRIN) once daily FERROUS SULFATE 324 MG  TBEC (FERROUS SULFATE) once daily ANTI-OXIDANT   TABS (MULTIPLE VITAMIN) once daily IBUPROFEN COLD/SINUS 30-200 MG  TABS (PSEUDOEPHEDRINE-IBUPROFEN) once daily  MELOXICAM 7.5 MG  TABS (MELOXICAM) once daily ZYRTEC ALLERGY 10 MG  TABS (CETIRIZINE HCL) as needed    Social History: Reviewed history from 05/17/2008 and no changes required. Patient never smoked.  marrried retired  Review of Systems      See HPI       Flu Vaccine Consent Questions     Do you have a history of severe allergic reactions to this vaccine? no    Any prior history of allergic reactions to egg and/or gelatin? no    Do you have a sensitivity to the preservative Thimersol? no    Do you have a past history of Guillan-Barre Syndrome? no    Do you currently  have an acute febrile illness? no    Have you ever had a severe reaction to latex? no    Vaccine information given and explained to patient? yes    Are you currently pregnant? no    Lot Number:[id number]   Exp Date:09/09/2010   Site Given  Left Deltoid IM Josph Macho RMA  December 16, 2009 2:00 PM   Physical Exam  General:  Well-developed,well-nourished,in no acute distress; alert,appropriate and cooperative throughout examination. Obese Head:  Normocephalic and atraumatic without obvious abnormalities. No apparent alopecia or balding. Mouth:  Oral mucosa and oropharynx without lesions or exudates.  Teeth in good repair. Neck:  No deformities, masses, or tenderness noted. Lungs:  Normal respiratory effort, chest expands symmetrically. Lungs are clear to auscultation, no crackles or wheezes. Heart:  Normal rate and regular rhythm. S1 and S2 normal without gallop, click, rub or other extra sounds.grade  2/6 systolic murmur.   Abdomen:  Bowel sounds positive,abdomen soft and non-tender without masses, organomegaly or hernias noted. Msk:  Left ankle more everted than right ankle Extremities:  1+ left pedal edema and 1+ right pedal edema.  Nonpitting Psych:  Cognition and judgment appear intact. Alert and cooperative with normal attention span and concentration. No apparent delusions, illusions, hallucinations   Impression & Recommendations:  Problem # 1:  ANKLE PAIN, LEFT (ICD-719.47)  Orders: T-Ankle Comp Left Min 3 Views (73610TC) Specimen Handling (16109) TLB-Uric Acid, Blood (84550-URIC) TLB-Sedimentation Rate (ESR) (85652-ESR) TLB-CBC Platelet - w/Differential (85025-CBCD) Try Tylenol as needed for pain and given Tramadol for severe pain. Will consider a diuretic for the doughy ankles if the uric acid level is not too hi  Problem # 2:  CHRONIC VENOUS HYPERTENSION WITHOUT COMPS (ICD-459.30) No concerning numbers today  Problem # 3:  ALLERGIC RHINITIS  (ICD-477.9)  Orders: T-Ankle Comp Left Min 3 Views (73610TC) Specimen Handling (60454) TLB-Uric Acid, Blood (84550-URIC) TLB-Sedimentation Rate (ESR) (85652-ESR) TLB-CBC Platelet - w/Differential (85025-CBCD)  Her updated medication list for this problem includes:    Flonase 50 Mcg/act Susp (Fluticasone propionate) .Marland Kitchen... 2 puffs at hs  Complete Medication List: 1)  Lithium Carbonate 300 Mg/450 Mg Tbcr (lithium Carbonate)  .... As directed 2)  Effexor 75 Mg Tabs (Venlafaxine hcl) .... 2 tabs every day 3)  Budeprion Sr 150 Mg Tb12 (Bupropion hcl) .... Once daily 4)  Levoxyl 75 Mcg Tabs (Levothyroxine sodium) .... Once daily 5)  Eql Omeprazole 20 Mg Tbec (Omeprazole) .... Once daily 6)  Adult Aspirin Ec Low Strength 81 Mg Tbec (Aspirin) .... Once daily 7)  Ferrous Sulfate 324 Mg Tbec (Ferrous sulfate) .... Once daily 8)  Anti-oxidant Tabs (Multiple vitamin) .... Once daily 9)  Toviaz 4 Mg Xr24h-tab (Fesoterodine fumarate) .... One by mouth once daily 10)  Flonase 50 Mcg/act Susp (Fluticasone  propionate) .... 2 puffs at hs 11)  Aricept 23 Mg Tabs (Donepezil hcl) .... Once daily 12)  Ambien Cr 6.25 Mg Cr-tabs (Zolpidem tartrate) .... Once daily at bedtime 13)  Zyprexa 5 Mg Tabs (Olanzapine) .... Once daily at bedtime 14)  Vitamin D 2000 Unit Tabs (Cholecalciferol) .... Once daily 15)  Tramadol Hcl 50 Mg Tabs (Tramadol hcl) .Marland Kitchen.. 1 tab by mouth three times a day as needed pain 16)  Allopurinol 100 Mg Tabs (Allopurinol) .... Once daily  Other Orders: Flu Vaccine 32yrs + MEDICARE PATIENTS (W2956) Administration Flu vaccine - MCR (O1308)  Patient Instructions: 1)  Please schedule a follow-up appointment in 2-3 weeks.  2)  You may move around but avoid painful motions. Apply ice to sore area for 20 minutes 3-4 times a day for 2-3 days.  3)  Take 650 - 1000 mg of tylenol every 4-6 hours as needed for relief of pain or comfort of fever. Avoid taking more than 3000 mg in a 24 hour period( can  cause liver damage in higher doses).  4)  If Tylenol ineffective then may try Tramadol sparingly for pain Prescriptions: TRAMADOL HCL 50 MG TABS (TRAMADOL HCL) 1 tab by mouth three times a day as needed pain  #40 x 1   Entered and Authorized by:   Danise Edge MD   Signed by:   Danise Edge MD on 12/16/2009   Method used:   Print then Give to Patient   RxID:   [id number]

## 2010-04-11 NOTE — Progress Notes (Signed)
Summary: med refill Levoxyl  Phone Note Refill Request Message from:  Patient  Refills Requested: Medication #1:  LEVOXYL 75 MCG  TABS once daily pt has one pill left needs refill today  Initial call taken by: Heron Sabins,  February 14, 2010 11:07 AM    Prescriptions: LEVOXYL 75 MCG  TABS (LEVOTHYROXINE SODIUM) once daily  #90 x 3   Entered by:   Sid Falcon LPN   Authorized by:   Evelena Peat MD   Signed by:   Sid Falcon LPN on 63/87/5643   Method used:   Electronically to        [facility]. [phone]* (retail)       [phone]       [place], [place]  [address]       Ph: [phone]       Fax: [phone]   RxID:   [id number]

## 2010-04-13 NOTE — Miscellaneous (Signed)
Summary: [facility]  Clinical Lists Changes  Medications: Added new medication of SPIRIVA HANDIHALER 18 MCG  CAPS (TIOTROPIUM BROMIDE MONOHYDRATE) Two puffs in handihaler daily - Signed Rx of SPIRIVA HANDIHALER 18 MCG  CAPS (TIOTROPIUM BROMIDE MONOHYDRATE) Two puffs in handihaler daily;  #30 x 6;  Signed;  Entered by: Storm Frisk MD;  Authorized by: Storm Frisk MD;  Method used: Electronically to [facility]. #[id number]*, [address] [place], [place], [place]  [address], Ph: [phone], Fax: [phone]    Prescriptions: SPIRIVA HANDIHALER 18 MCG  CAPS (TIOTROPIUM BROMIDE MONOHYDRATE) Two puffs in handihaler daily  #30 x 6   Entered and Authorized by:   Storm Frisk MD   Signed by:   Storm Frisk MD on 03/08/2010   Method used:   Electronically to        [facility]. [phone]* (retail)       [phone]       [place], [place]  [address]       Ph: [phone]       Fax: [phone]   RxID:   [id number]

## 2010-04-13 NOTE — Miscellaneous (Signed)
Summary: Orders Update pft charges  Clinical Lists Changes  Orders: Added new Service order of Spirometry (Pre & Post) (94060) - Signed 

## 2010-04-13 NOTE — Miscellaneous (Signed)
Summary: Orders Update  Clinical Lists Changes  Orders: Added new Referral order of Pulmonary Referral (Pulmonary) - Signed 

## 2010-04-13 NOTE — Progress Notes (Signed)
Summary: Throat congestion, requesting advice, Rx  Phone Note Call from Patient Call back at Home Phone [phone]   Caller: Patient Call For: Evelena Peat MD Summary of Call: PC from pt, reports had OV last week and was told to call Dr Caryl Never if her signs have not improved.  C/O throat congestion, especially in the morning.  Denies fever & headache, only occasional cough.  Requesting something for her throat congestion.  Pt threw the Doxycycline written Rx away as she did not have any of the symptoms he described. [facility] Initial call taken by: Sid Falcon LPN,  March 09, 2010 10:36 AM  Follow-up for Phone Call        hydrate well and try OTC plain mucinex.  Follow-up by: Evelena Peat MD,  March 09, 2010 1:07 PM  Additional Follow-up for Phone Call Additional follow up Details #1::        Husband informed Additional Follow-up by: Sid Falcon LPN,  March 09, 2010 1:28 PM

## 2010-04-13 NOTE — Assessment & Plan Note (Signed)
Summary: FU PER PT/NJR   Vital Signs:  Patient profile:   76 year old female Weight:      195 pounds O2 Sat:      96 % Temp:     98.1 degrees F Pulse rate:   82 / minute BP sitting:   140 / 70  (left arm) Cuff size:   large  Vitals Entered By: Pura Spice, RN (February 17, 2010 1:22 PM) CC: talk about astelin wants to see pulmonologist,geriatric and a podiatrist   History of Present Illness: Patient is seen with persistent dyspnea. Refer to prior note. Chest x-ray unremarkable. Echocardiogram no evidence for left ventricular systolic dysfunction. No major valvular abnormality. No dyspnea at rest. Dyspnea is activity related and not associated with chest pain. Has shortness of breath after walking about 5-10 yards. Improved with rest. No history of asthma. No history of smoking. Denies any chronic cough, pleuritic pain, or hemoptysis.  Progressive edema feet and legs.  recent electrolytes normal. No history of heart failure.  BNP recently 20.  Hypothyroid and needs f/u TSH.  compliant with therapy.  History of perennial allergies. Has had some improvement with Astelin nasal spray. Patient denies any orthopnea.  Weight stable.  Allergies: 1)  ! Macrobid (Nitrofurantoin Monohyd Macro) 2)  ! Amoxicillin (Amoxicillin) 3)  ! Zegerid (Omeprazole-Sodium Bicarbonate) 4)  ! Sulfasalazine (Sulfasalazine)  Past History:  Past Medical History: Last updated: 05/17/2008 G E R D Hypothroidism  bipolar disorder obesity  hx of vericose veins Anemia hx of D&C pulmonary nodule/sarcoidosis Urinary urge incontinence  Current Meds:  * LITHIUM CARBONATE 300 MG/450 MG  TBCR (LITHIUM CARBONATE) as directed EFFEXOR 75 MG  TABS (VENLAFAXINE HCL) 2 tabs every day BUDEPRION SR 150 MG  TB12 (BUPROPION HCL) once daily LEVOXYL 75 MCG  TABS (LEVOTHYROXINE SODIUM) once daily EQL OMEPRAZOLE 20 MG  TBEC (OMEPRAZOLE) once daily ADULT ASPIRIN EC LOW STRENGTH 81 MG  TBEC (ASPIRIN) once daily FERROUS  SULFATE 324 MG  TBEC (FERROUS SULFATE) once daily ANTI-OXIDANT   TABS (MULTIPLE VITAMIN) once daily IBUPROFEN COLD/SINUS 30-200 MG  TABS (PSEUDOEPHEDRINE-IBUPROFEN) once daily MELOXICAM 7.5 MG  TABS (MELOXICAM) once daily ZYRTEC ALLERGY 10 MG  TABS (CETIRIZINE HCL) as needed    Family History: Last updated: 04/04/2007 no lung dz or cancer  Social History: Last updated: 05/17/2008 Patient never smoked.  marrried retired  Risk Factors: Smoking Status: never (04/04/2007) PMH-FH-SH reviewed for relevance  Review of Systems       The patient complains of dyspnea on exertion and peripheral edema.  The patient denies anorexia, fever, weight loss, weight gain, hoarseness, chest pain, syncope, prolonged cough, hemoptysis, abdominal pain, melena, hematochezia, severe indigestion/heartburn, and muscle weakness.    Physical Exam  General:  Well-developed,well-nourished,in no acute distress; alert,appropriate and cooperative throughout examination Mouth:  Oral mucosa and oropharynx without lesions or exudates.  Teeth in good repair. Neck:  No deformities, masses, or tenderness noted. Lungs:  Normal respiratory effort, chest expands symmetrically. Lungs are clear to auscultation, no crackles or wheezes. Heart:  normal rate and regular rhythm.   Extremities:  patient has 1+ pitting edema feet ankles and lower legs bilaterally. No foot lesions or skin lesions noted Neurologic:  alert & oriented X3, cranial nerves II-XII intact, and strength normal in all extremities.   Psych:  normally interactive, good eye contact, not depressed appearing, and slightly anxious.     Impression & Recommendations:  Problem # 1:  LEG EDEMA (ICD-782.3) ?venous stasis.  No  evidence for CHF.  Discussed venous compression garments and she has been reluctant in past.  Problem # 2:  DYSPNEA (ICD-786.05) ?deconditoning.  ?obesity hypoventilation.  Spirometry mild obstruction.  Consider Pulmonary  referral. Orders: Spirometry w/Graph (94010)  Problem # 3:  HYPOTHYROIDISM (ICD-244.9) repeat TSH. Her updated medication list for this problem includes:    Levoxyl 75 Mcg Tabs (Levothyroxine sodium) ..... Once daily  Orders: Specimen Handling (16109) Venipuncture (60454) TLB-TSH (Thyroid Stimulating Hormone) (84443-TSH)  Complete Medication List: 1)  Lithium Carbonate 300 Mg/450 Mg Tbcr (lithium Carbonate)  .... As directed 2)  Effexor 75 Mg Tabs (Venlafaxine hcl) .... 2 tabs every day 3)  Budeprion Sr 150 Mg Tb12 (Bupropion hcl) .... Once daily 4)  Levoxyl 75 Mcg Tabs (Levothyroxine sodium) .... Once daily 5)  Eql Omeprazole 20 Mg Tbec (Omeprazole) .... Once daily 6)  Adult Aspirin Ec Low Strength 81 Mg Tbec (Aspirin) .... Once daily 7)  Ferrous Sulfate 324 Mg Tbec (Ferrous sulfate) .... Once daily 8)  Anti-oxidant Tabs (Multiple vitamin) .... Once daily 9)  Toviaz 4 Mg Xr24h-tab (Fesoterodine fumarate) .... One by mouth once daily 10)  Astelin 137 Mcg/spray Soln (Azelastine hcl) .Marland Kitchen.. 1-2 sprays per nostril two times a day 11)  Aricept 23 Mg Tabs (Donepezil hcl) .... Once daily 12)  Ambien Cr 6.25 Mg Cr-tabs (Zolpidem tartrate) .... Once daily at bedtime 13)  Zyprexa 5 Mg Tabs (Olanzapine) .... Once daily at bedtime 14)  Vitamin D 2000 Unit Tabs (Cholecalciferol) .... Once daily 15)  Allopurinol 100 Mg Tabs (Allopurinol) .... Once daily  Patient Instructions: 1)  Call me by Monday or Tuesday with name of pulmonologist if you have a preference. 2)  Limit your Sodium(salt) .  3)  Elevate legs frequently.   Orders Added: 1)  Spirometry w/Graph [94010] 2)  Specimen Handling [99000] 3)  Venipuncture [36415] 4)  TLB-TSH (Thyroid Stimulating Hormone) [84443-TSH] 5)  Est. Patient Level IV [09811]

## 2010-04-13 NOTE — Assessment & Plan Note (Signed)
Summary: Pulmonary Consultation   Copy to:  [facility] Primary Provider/Referring Provider:  Caryl Never  CC:  Pulmonary Consult -- seen by MW in 2009.  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  Her complaints dramatically improved off of Macrodantin.  She returns today continuing to complain of mild dyspnea but only with exertion, not with ADLs, and denies any variability or associated chest pain fever chills sweats orthopnea PND or leg swelling myalgias arthralgias or unintended weight loss.   Pt also denies any obvious fluctuation in symptoms with weather or environmental change or other alleviating or aggravating factors.    She returns today for follow-up chest x-ray because she had a right pulmonary nodule with indeterminate PET scanning dated November 29, 2006 associated with uptake in the mediastinal hilar distribution and calcification suggested that she might have had previous granulomatous disease or sarcoidosis.  She denies any significant cough or dysphagia.    February 22, 2010 3:37 PM This pt wants second opinion Last seen by [facility] 2009.   as per above documentation.  AT that time ? of macrodantin pulmonary toxicity. This pt was on macrodantin and better with dyspnea off macrodantin in 11/2007. The pt now notes for three years has to stop after walking a few yards.  The pt notes   34yrs of dyspnea on exertion.  ALso notes more edema in LE.  Pt denies cough or chest pain.  There is no wheeze or mucus production.    There is no fever/chills/sweats noted.      Preventive Screening-Counseling & Management  Alcohol-Tobacco     Smoking Status: never  Current Medications (verified): 1)  Lithium Carbonate 450 Mg Cr-Tabs (Lithium Carbonate) .Marland Kitchen.. 1 in The Evening 2)  Effexor 75 Mg  Tabs  (Venlafaxine Hcl) .... 2 Tabs Every Day 3)  Budeprion Sr 150 Mg  Tb12 (Bupropion Hcl) .... Once Daily 4)  Levoxyl 75 Mcg  Tabs (Levothyroxine Sodium) .... Once Daily 5)  Eql Omeprazole 20 Mg  Tbec (Omeprazole) .... Once Daily 6)  Adult Aspirin Ec Low Strength 81 Mg  Tbec (Aspirin) .... Once Daily 7)  Ferrous Sulfate 324 Mg  Tbec (Ferrous Sulfate) .... Once Daily 8)  Anti-Oxidant   Tabs (Multiple Vitamin) .... Once Daily 9)  Astelin 137 Mcg/spray Soln (Azelastine Hcl) .Marland Kitchen.. 1-2 Sprays Per Nostril Two Times A Day 10)  Aricept 23 Mg Tabs (Donepezil Hcl) .... Once Daily 11)  Ambien Cr 6.25 Mg Cr-Tabs (Zolpidem Tartrate) .... Once Daily At Bedtime 12)  Zyprexa 5 Mg Tabs (Olanzapine) .... Once Daily At Bedtime 13)  Vitamin D 2000 Unit Tabs (Cholecalciferol) .... Once Daily 14)  Allopurinol 100 Mg Tabs (Allopurinol) .... Once Daily As Needed  Allergies (verified): 1)  ! Macrobid (Nitrofurantoin Monohyd Macro) 2)  ! Amoxicillin (Amoxicillin) 3)  ! Zegerid (Omeprazole-Sodium Bicarbonate) 4)  ! Sulfasalazine (Sulfasalazine)  Past History:  Past medical, surgical, family and social histories (including risk factors) reviewed, and no changes noted (except as noted below).  Past Medical History: G E R D Hypothroidism bipolar disorder obesity hx of varicose veins Anemia pulmonary nodule Urinary urge incontinence  Past Surgical History: D&C x 2   Family History: Reviewed history from 04/04/2007 and no changes required. no lung dz or cancer mother - stroke  Social History: Reviewed history from 05/17/2008 and no  changes required. Patient never smoked.  marrried.  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  The patient denies shortness of breath at rest, productive cough, non-productive cough, coughing up  blood, chest pain, irregular heartbeats, acid heartburn, indigestion, loss of appetite, abdominal pain, difficulty swallowing, sore throat, tooth/dental problems, headaches, sneezing, itching, ear ache, anxiety, depression, joint stiffness or pain, rash, change in color of mucus, and fever.    Vital Signs:  Patient profile:   76 year old female Height:      57 inches Weight:      196.13 pounds BMI:     42.60 O2 Sat:      96 % on Room air Temp:     97.6 degrees F oral Pulse rate:   78 / minute BP sitting:   110 / 64  (left arm) Cuff size:   large  Vitals Entered By: Gweneth Dimitri RN (February 22, 2010 3:12 PM)  O2 Flow:  Room air  Serial Vital Signs/Assessments:  Comments: 4:10 PM Ambulatory Pulse Oximetry  Resting; HR__64___    02 Sat__93%ra___  Lap1 (185 feet)   HR__81___   02 Sat__74%ra- pt's o2 sat decreased to 74%ra after just walking less than 10 steps- this increased to 96% after started on o2 at 2lpm Lap2 (185 feet)   HR_____   02 Sat_____    Lap3 (185 feet)   HR_____   02 Sat_____  ___Test Completed without Difficulty ___Test Stopped due to:   By: Vernie Murders   CC: Pulmonary Consult -- seen by MW in 2009.  abnormal PFT and DOE. Comments Medications reviewed with patient Daytime contact number verified with patient. Gweneth Dimitri RN  February 22, 2010 3:12 PM    Physical Exam  Additional Exam:  Ambulatory healthy appearing in no acute distress. HEENT: nl dentition, turbinates, and orophanx. Nl external ear canals without cough reflex Neck without JVD/Nodes/TM Lungsdistant BS, prolonged expiratory phase.   RRR no s3 or murmur or increase in P2 Abd soft and benign with nl excursion in the supine position. No bruits or organomegaly Ext warm without calf tenderness, cyanosis clubbing,  3+ edema to mid tibial area Skin warm and dry without lesions     CT of Chest  Procedure date:  02/23/2010  Findings:      Findings:  Satisfactory opacification of the  pulmonary arteries noted, and there is no evidence of pulmonary emboli.  There is no evidence of thoracic aortic aneurysm or dissection.   Shotty and partially calcified mediastinal and bilateral hilar lymph nodes are again seen but show decreased prominence since prior study in 2008.  No new or increased areas of lymphadenopathy are identified.   Previously seen pleural effusions and interstitial infiltrates have resolved.  There is no evidence of suspicious pulmonary nodules or masses.  There is no evidence of active infiltrate.  There is no evidence of pulmonary interstitial fibrosis or bronchiectasis on high-resolution images of the lungs.  The heart size remains within normal limits.   Review of the MIP images confirms the above findings.   IMPRESSION:   1.  No evidence of pulmonary embolism or other active disease. 2.  No evidence of pulmonary nodules or masses.  No evidence  of pulmonary fibrosis.  3.  Decreased prominence of shotty and partially calcified mediastinal and hilar lymph nodes  since prior exam.  There is no new or progressive disease within the thorax.  Impression & Recommendations:  Problem # 1:  LEG EDEMA (ICD-782.3) Assessment Deteriorated ongoing LE edema, I suspect is due to hypoxemia.  r/o pulmonary embolii,  r/o pulm fibrosis,  note 2D echo showed mild diastolic CHF plan start low dose lasix and potassium supp  Orders: New Patient Level V (65784)  Problem # 2:  HYPOXEMIA (ICD-799.02) Assessment: Deteriorated unclear etiology of hypoxemia  ?obstructive disease, ? pulm embolii,  ?pulm fibrosis plan start supp oxygen 2L obtain CT chest and full pfts.  note CT chest neg for PE and pulm fibrosis or emphysema Orders: DME Referral (DME)  Medications Added to Medication List This Visit: 1)  Lithium Carbonate 450 Mg Cr-tabs (Lithium carbonate) .Marland Kitchen.. 1 in the evening 2)  Allopurinol 100 Mg Tabs (Allopurinol) .... Once daily as needed 3)  Furosemide  20 Mg Tabs (Furosemide) .... One by mouth daily 4)  Klor-con 10 10 Meq Cr-tabs (Potassium chloride) .... One by mouth daily 5)  Oxygen  .... 2 liter rest; 3liters exertion  Complete Medication List: 1)  Lithium Carbonate 450 Mg Cr-tabs (Lithium carbonate) .Marland Kitchen.. 1 in the evening 2)  Effexor 75 Mg Tabs (Venlafaxine hcl) .... 2 tabs every day 3)  Budeprion Sr 150 Mg Tb12 (Bupropion hcl) .... Once daily 4)  Levoxyl 75 Mcg Tabs (Levothyroxine sodium) .... Once daily 5)  Eql Omeprazole 20 Mg Tbec (Omeprazole) .... Once daily 6)  Adult Aspirin Ec Low Strength 81 Mg Tbec (Aspirin) .... Once daily 7)  Ferrous Sulfate 324 Mg Tbec (Ferrous sulfate) .... Once daily 8)  Anti-oxidant Tabs (Multiple vitamin) .... Once daily 9)  Astelin 137 Mcg/spray Soln (Azelastine hcl) .Marland Kitchen.. 1-2 sprays per nostril two times a day 10)  Aricept 23 Mg Tabs (Donepezil hcl) .... Once daily 11)  Ambien Cr 6.25 Mg Cr-tabs (Zolpidem tartrate) .... Once daily at bedtime 12)  Zyprexa 5 Mg Tabs (Olanzapine) .... Once daily at bedtime 13)  Vitamin D 2000 Unit Tabs (Cholecalciferol) .... Once daily 14)  Allopurinol 100 Mg Tabs (Allopurinol) .... Once daily as needed 15)  Furosemide 20 Mg Tabs (Furosemide) .... One by mouth daily 16)  Klor-con 10 10 Meq Cr-tabs (Potassium chloride) .... One by mouth daily 17)  Oxygen  .... 2 liter rest; 3liters exertion  Other Orders: Pulse Oximetry, Ambulatory (69629) Pulmonary Referral (Pulmonary) Radiology Referral (Radiology)  Patient Instructions: 1)  A CT of the Chest will be obtained 2)  A full pulmonary function study will be obtained 3)  Start Lasix one daily with potassium daily 4)  Start oxygen 2Liter rest 3Liter exertion 5)  No other medication changes 6)  Return after above studies completed  Prescriptions: OXYGEN 2 liter rest; 3Liters exertion  #1 x 0   Entered and Authorized by:   Storm Frisk MD   Signed by:   Storm Frisk MD on 02/22/2010   Method used:   Print  then Give to Patient   RxID:   [id number] KLOR-CON 10 10 MEQ CR-TABS (POTASSIUM CHLORIDE) one by mouth daily  #30 x 6   Entered and Authorized by:   Storm Frisk MD   Signed by:   Storm Frisk MD on 02/22/2010   Method used:   Electronically to        [facility]. [phone]* (retail)       [phone]       [facility]  [place], [place]  [address]       Ph: [phone]       Fax: [phone]   RxID:   [id number] FUROSEMIDE 20 MG TABS (FUROSEMIDE) One by mouth daily  #30 x 6   Entered and Authorized by:   Storm Frisk MD   Signed by:   Storm Frisk MD on 02/22/2010   Method used:   Electronically to        [facility]. [phone]* (retail)       [phone]       [place], [place]  [address]       Ph: [phone]       Fax: [phone]   RxID:   [id number]     Appended Document: Pulmonary Consultation result noted  patient aware

## 2010-04-13 NOTE — Letter (Signed)
Summary: CMN for Oxygen / [facility]  CMN for Oxygen / [facility]   Imported By: Lennie Odor 04/03/2010 11:28:23  _____________________________________________________________________  External Attachment:    Type:   Image     Comment:   External Document

## 2010-04-13 NOTE — Progress Notes (Signed)
Summary: Needs OV  Phone Note Outgoing Call   Reason for Call: Confirm/change Appt Summary of Call: Pt needs OV in January Initial call taken by: Storm Frisk MD,  March 08, 2010 9:54 AM  Follow-up for Phone Call        [facility]  Gweneth Dimitri RN  March 08, 2010 1:23 PM   Additional Follow-up for Phone Call Additional follow up Details #1::        ok keep trying Additional Follow-up by: Storm Frisk MD,  March 09, 2010 10:45 AM    Additional Follow-up for Phone Call Additional follow up Details #2::    Pt already has OV scheduled for Mar 31, 2010 at 4:30.   Called, spoke with pt.  She was reminded of appt date and time and verbalized understanding of this. Follow-up by: Gweneth Dimitri RN,  March 15, 2010 8:30 AM

## 2010-04-13 NOTE — Progress Notes (Signed)
Summary: O2 questions  Phone Note Call from Patient Call back at Home Phone [phone]   Caller: Patient Call For: wright Summary of Call: Pt has questions about her O2 usage. Initial call taken by: Darletta Moll,  March 01, 2010 10:55 AM  Follow-up for Phone Call        Spoke with pt and she states she does not like using her oxygen all the time and wants to know if she can stop using it with rest and just use it with exertion. According to last OV note pt ony walked a few steps and sats decresed to 74%. I advised the pt to continue oxygen as directed until PW returns and we can address with him at that time.  Pt agreed with this plan. Carron Curie CMA  March 01, 2010 2:49 PM   Additional Follow-up for Phone Call Additional follow up Details #1::        she must use 24/7  no exceptions   that is the rx Additional Follow-up by: Storm Frisk MD,  March 03, 2010 9:26 AM    Additional Follow-up for Phone Call Additional follow up Details #2::    [email or web address]. to advise pt.[facility] CMA  March 03, 2010 9:28 AM  Pt informed. Zackery Barefoot CMA  March 03, 2010 11:59 AM

## 2010-04-13 NOTE — Procedures (Signed)
Summary: 02 SAT / [facility]  02 SAT / [facility]   Imported By: Lennie Odor 03/14/2010 15:14:25  _____________________________________________________________________  External Attachment:    Type:   Image     Comment:   External Document

## 2010-04-13 NOTE — Miscellaneous (Signed)
Summary: PFTs   Pulmonary Function Test Date: 02/27/2010 Height (in.): 57 Gender: Female  Pre-Spirometry FVC    Value: 2.24 L/min   Pred: 1.95 L/min     % Pred: 115 % FEV1    Value: 1.64 L     Pred: 1.32 L     % Pred: 125 % FEV1/FVC  Value: 73 %     Pred: 72 %    FEF 25-75  Value: 0.97 L/min   Pred: 1.78 L/min     % Pred: 55 %  Post-Spirometry FVC    Value: 1.99 L/min   Pred: 1.95 L/min     % Pred: 102 % FEV1    Value: 1.58 L     Pred: 1.32 L     % Pred: 120 % FEV1/FVC  Value: 80 %     Pred: 72 %    FEF 25-75  Value: 1.69 L/min   Pred: 1.78 L/min     % Pred: 95 %  Comments: peripheral airflow obstruction Clinical Lists Changes  Observations: Added new observation of PFT COMMENTS: peripheral airflow obstruction (03/01/2010 16:19) Added new observation of FEF2575%EXPS: 95 % (03/01/2010 16:19) Added new observation of PSTFEF25/75P: 1.78  (03/01/2010 16:19) Added new observation of PSTFEF25/75%: 1.69 L/min (03/01/2010 16:19) Added new observation of FEV1FVCPRDPS: 72 % (03/01/2010 16:19) Added new observation of PSTFEV1/FVC: 80 % (03/01/2010 16:19) Added new observation of POSTFEV1%PRD: 120 % (03/01/2010 16:19) Added new observation of FEV1PRDPST: 1.32 L (03/01/2010 16:19) Added new observation of POST FEV1: 1.58 L/min (03/01/2010 16:19) Added new observation of POST FVC%EXP: 102 % (03/01/2010 16:19) Added new observation of FVCPRDPST: 1.95 L/min (03/01/2010 16:19) Added new observation of POST FVC: 1.99 L (03/01/2010 16:19) Added new observation of FEF % EXPEC: 55 % (03/01/2010 16:19) Added new observation of FEF25-75%PRE: 1.78 L/min (03/01/2010 16:19) Added new observation of FEF 25-75%: 0.97 L/min (03/01/2010 16:19) Added new observation of FEV1/FVC PRE: 72 % (03/01/2010 16:19) Added new observation of FEV1/FVC: 73 % (03/01/2010 16:19) Added new observation of FEV1 % EXP: 125 % (03/01/2010 16:19) Added new observation of FEV1 PREDICT: 1.32 L (03/01/2010 16:19) Added new  observation of FEV1: 1.64 L (03/01/2010 16:19) Added new observation of FVC % EXPECT: 115 % (03/01/2010 16:19) Added new observation of FVC PREDICT: 1.95 L (03/01/2010 16:19) Added new observation of FVC: 2.24 L (03/01/2010 16:19) Added new observation of PFT HEIGHT: 57  (03/01/2010 16:19) Added new observation of PFT DATE: 02/27/2010  (03/01/2010 16:19)

## 2010-04-13 NOTE — Assessment & Plan Note (Signed)
Summary: Pulmonary OV   Copy to:  [facility] Primary Provider/Referring Provider:  [facility]te  CC:  Follow up.  Pt states she still has SOB with exertion but this is unchanged.  Denies wheezing, chest tightness, and cough..  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  Her complaints dramatically improved off of Macrodantin.  She returns today continuing to complain of mild dyspnea but only with exertion, not with ADLs, and denies any variability or associated chest pain fever chills sweats orthopnea PND or leg swelling myalgias arthralgias or unintended weight loss.   Pt also denies any obvious fluctuation in symptoms with weather or environmental change or other alleviating or aggravating factors.    She returns today for follow-up chest x-ray because she had a right pulmonary nodule with indeterminate PET scanning dated November 29, 2006 associated with uptake in the mediastinal hilar distribution and calcification suggested that she might have had previous granulomatous disease or sarcoidosis.  She denies any significant cough or dysphagia.    February 22, 2010 3:37 PM This pt wants second opinion Last seen by [facility] 2009.   as per above documentation.  AT that time ? of macrodantin pulmonary toxicity. This pt was on macrodantin and better with dyspnea off macrodantin in 11/2007. The pt now notes for three years has to stop after walking a few yards.  The pt notes   56yrs of dyspnea on exertion.  ALso notes more edema in LE.  Pt denies cough or chest pain.  There is no wheeze or mucus production.    There is no fever/chills/sweats noted.    March 31, 2010 4:33 PM Pt notes oxygen rx non compliant  pfts showed obstruction and now on spiriva Pt is improved with dyspnea and cough. No new issues   Preventive  Screening-Counseling & Management  Alcohol-Tobacco     Smoking Status: never  Current Medications (verified): 1)  Lithium Carbonate 450 Mg Cr-Tabs (Lithium Carbonate) .Marland Kitchen.. 1 in The Evening 2)  Effexor 75 Mg  Tabs (Venlafaxine Hcl) .... 2 Tabs Every Day 3)  Budeprion Sr 150 Mg  Tb12 (Bupropion Hcl) .... Once Daily 4)  Levoxyl 75 Mcg  Tabs (Levothyroxine Sodium) .... Once Daily 5)  Eql Omeprazole 20 Mg  Tbec (Omeprazole) .... Once Daily 6)  Adult Aspirin Ec Low Strength 81 Mg  Tbec (Aspirin) .... Once Daily 7)  Ferrous Sulfate 324 Mg  Tbec (Ferrous Sulfate) .... Once Daily 8)  Anti-Oxidant   Tabs (Multiple Vitamin) .... Once Daily 9)  Donepezil Hcl 10 Mg Tabs (Donepezil Hcl) .... 2 Once Daily 10)  Ambien Cr 6.25 Mg Cr-Tabs (Zolpidem Tartrate) .... Once Daily At Bedtime 11)  Zyprexa 5 Mg Tabs (Olanzapine) .... Once Daily At Bedtime 12)  Vitamin D 2000 Unit Tabs (Cholecalciferol) .... Once Daily 13)  Allopurinol 100 Mg Tabs (Allopurinol) .... Once Daily As Needed 14)  Furosemide 20 Mg Tabs (Furosemide) .... One By Mouth Daily 15)  Klor-Con 10 10 Meq Cr-Tabs (Potassium Chloride) .... One By Mouth Daily 16)  Oxygen .... 2 Liter Rest; 3liters Exertion 17)  Spiriva Handihaler 18 Mcg  Caps (Tiotropium Bromide Monohydrate) .... Two Puffs in Handihaler Daily  Allergies (verified): 1)  ! Macrobid (Nitrofurantoin Monohyd Macro) 2)  ! Amoxicillin (Amoxicillin) 3)  ! Zegerid (Omeprazole-Sodium Bicarbonate) 4)  ! Sulfasalazine (Sulfasalazine)  Past History:  Past medical,  surgical, family and social histories (including risk factors) reviewed, and no changes noted (except as noted below).  Past Medical History: G E R D Hypothroidism bipolar disorder obesity hx of varicose veins Anemia pulmonary nodule Urinary urge incontinence Tremors  Memory loss  Past Surgical History: Reviewed history from 02/22/2010 and no changes required. D&C x 2   Family History: Reviewed history from  02/22/2010 and no changes required. no lung dz or cancer mother - stroke  Social History: Reviewed history from 02/22/2010 and no changes required. Patient never smoked.  marrried.  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  The patient denies shortness of breath at rest, productive cough, non-productive cough, coughing up blood, chest pain, irregular heartbeats, acid heartburn, indigestion, loss of appetite, weight change, abdominal pain, difficulty swallowing, sore throat, tooth/dental problems, headaches, nasal congestion/difficulty breathing through nose, sneezing, itching, ear ache, anxiety, depression, hand/feet swelling, joint stiffness or pain, rash, change in color of mucus, and fever.    Vital Signs:  Patient profile:   76 year old female Height:      57 inches Weight:      199.50 pounds BMI:     43.33 O2 Sat:      96 % on Room air Temp:     98.2 degrees F oral Pulse rate:   79 / minute BP sitting:   126 / 70  (right arm) Cuff size:   large  Vitals Entered By: Gweneth Dimitri RN (March 31, 2010 4:20 PM)  Nutrition Counseling: Patient's BMI is greater than 25 and therefore counseled on weight management options.  O2 Flow:  Room air CC: Follow up.  Pt states she still has SOB with exertion but this is unchanged.  Denies wheezing, chest tightness, cough. Comments Medications reviewed with patient Daytime contact number verified with patient. Gweneth Dimitri RN  March 31, 2010 4:20 PM    Physical Exam  Additional Exam:  Ambulatory healthy appearing in no acute distress. HEENT: nl dentition, turbinates, and orophanx. Nl external ear canals without cough reflex Neck without JVD/Nodes/TM Lungsdistant BS, prolonged expiratory phase.   RRR no s3 or murmur or increase in P2 Abd soft and benign with nl excursion in the supine position. No bruits or organomegaly Ext warm without  calf tenderness, cyanosis clubbing,  3+ edema to mid tibial area Skin warm and dry without lesions     Impression & Recommendations:  Problem # 1:  BRONCHITIS, OBSTRUCTIVE CHRONIC (ICD-491.20) Assessment Improved asthmatic bronchitis iimproved on spiriva hypoxemia is better plan cont spiriva may wear oxygen for exertion and at bedtime  Orders: Est. Patient Level III (84696)  Medications Added to Medication List This Visit: 1)  Donepezil Hcl 10 Mg Tabs (Donepezil hcl) .... 2 once daily  Complete Medication List: 1)  Lithium Carbonate 450 Mg Cr-tabs (Lithium carbonate) .Marland Kitchen.. 1 in the evening 2)  Effexor 75 Mg Tabs (Venlafaxine hcl) .... 2 tabs every day 3)  Budeprion Sr 150 Mg Tb12 (Bupropion hcl) .... Once daily 4)  Levoxyl 75 Mcg Tabs (Levothyroxine sodium) .... Once daily 5)  Eql Omeprazole 20 Mg Tbec (Omeprazole) .... Once daily 6)  Adult Aspirin Ec Low Strength 81 Mg Tbec (Aspirin) .... Once daily 7)  Ferrous Sulfate 324 Mg Tbec (Ferrous sulfate) .... Once daily 8)  Anti-oxidant Tabs (Multiple vitamin) .... Once daily 9)  Donepezil Hcl 10 Mg Tabs (Donepezil hcl) .... 2  once daily 10)  Ambien Cr 6.25 Mg Cr-tabs (Zolpidem tartrate) .... Once daily at bedtime 11)  Zyprexa 5 Mg Tabs (Olanzapine) .... Once daily at bedtime 12)  Vitamin D 2000 Unit Tabs (Cholecalciferol) .... Once daily 13)  Allopurinol 100 Mg Tabs (Allopurinol) .... Once daily as needed 14)  Furosemide 20 Mg Tabs (Furosemide) .... One by mouth daily 15)  Klor-con 10 10 Meq Cr-tabs (Potassium chloride) .... One by mouth daily 16)  Oxygen  .... 2 liter rest; 3liters exertion 17)  Spiriva Handihaler 18 Mcg Caps (Tiotropium bromide monohydrate) .... Two puffs in handihaler daily  Patient Instructions: 1)  You can stop oxygen during day when sitting still,  wear oxygen with exertion and at night. 2)  Stay on Spiriva daily 3)  Return 2 months

## 2010-04-13 NOTE — Progress Notes (Signed)
Summary: change doctors-LMOMTCB x 1  Phone Note Call from Patient Call back at Home Phone [phone]   Caller: Patient Call For: wert Summary of Call: Pt wants to last saw MW on 1/09 and wants to switch to PW for pulmonary eval pls advise. Initial call taken by: Darletta Moll,  February 20, 2010 10:55 AM  Follow-up for Phone Call        Pt would like to change from Dr. Sherene Sires to Dr. Delford Field.  I advised of the protocol -- we have to have both drs approval of the change.  Pt's husband then got on the phone and stated pt was given the appt with Dr. Sherene Sires after being in the hospital for pna.  States they were not able to choose who they were going to see and didn't understanding why we had to have both drs aprove this change.  I informed him this is office protocol and he then stated "can I speak to your office manager."  I spoke with Raliegh Scarlet.  Gave pt's husband her direct # -- he will call.  Follow-up by: Gweneth Dimitri RN,  February 20, 2010 12:57 PM  Additional Follow-up for Phone Call Additional follow up Details #1::        Patient's husband called to demand that his wife be allowed to choose to see whomever she wishes.  He stated that patients have rights and furthermore she did not choose to see Dr. Sherene Sires in hospital.  I explained protocol and said that I would expedite request.  Patient's husband demanded that both doctors be notified this afternoon and that I respond to his request today.  He can be reached at [phone]. Additional Follow-up by: Leonette Monarch,  February 20, 2010 1:03 PM    Additional Follow-up for Phone Call Additional follow up Details #2::    Will forward message to Dr. Sherene Sires.  pls advise.  Thanks! Gweneth Dimitri RN  February 20, 2010 1:10 PM I am fine with her seeing Dr Delford Field though note she has h/o Manic Depessive disorder so ? is she being referred by a primary doctor and if not is Dr Delford Field willing to see her as a self referral? Follow-up by: Nyoka Cowden MD,   February 20, 2010 1:26 PM  Additional Follow-up for Phone Call Additional follow up Details #3:: Details for Additional Follow-up Action Taken: Spoke with pt and advised that we will forward msg to PW to see if okay to switch.  Pt verbalized understanding.  We were planning referral back to pulmonary as per last note. Will go ahead with referral with above to be worked out. Evelena Peat MD  February 20, 2010 5:50 PM  ok to book her wiht me   Spoke with pt and sched her to see PW tommorrow pm at 3:30 and advised her to come in approx 15 min prior to this in order to fill out new pt information.  Pt verbalized understanding.    pw  LMOMTCB Vernie Murders  February 21, 2010 1:57 PM Returning call.Darletta Moll  February 21, 2010 2:53 PM  Vernie Murders  February 21, 2010 3:12 PM  Additional Follow-up by: Vernie Murders,  February 20, 2010 1:58 PM

## 2010-04-13 NOTE — Assessment & Plan Note (Signed)
Summary: sorethroat/laryngitis/stuffy nose/cjr   Vital Signs:  Patient profile:   76 year old female Temp:     98.2 degrees F oral BP sitting:   130 / 70  (left arm) Cuff size:   large  Vitals Entered By: Sid Falcon LPN (March 02, 2010 4:03 PM)  History of Present Illness: Here for acute issue of few day hx of nasal cong, dry cough, sore throat, laryngitis.  No fever.  Pt has some chronic dyspnea or unclear origin. ?hx of macrodantin pulm toxicity. Recent CT chest no PE or any other acute abnormality. Pt is nonsmoker.  Only took furosemide for 2 days.  Didn't like "taste".  Nnosmoker.   Allergies: 1)  ! Macrobid (Nitrofurantoin Monohyd Macro) 2)  ! Amoxicillin (Amoxicillin) 3)  ! Zegerid (Omeprazole-Sodium Bicarbonate) 4)  ! Sulfasalazine (Sulfasalazine)  Past History:  Past Medical History: Last updated: 02/22/2010 [facility] Hypothroidism bipolar disorder obesity hx of varicose veins Anemia pulmonary nodule Urinary urge incontinence  Past Surgical History: Last updated: 02/22/2010 D&C x 2   Family History: Last updated: 02/22/2010 no lung dz or cancer mother - stroke  Social History: Last updated: 02/22/2010 Patient never smoked.  marrried.  Lives with husband 2 children retired [employer] operational officer   Risk Factors: Smoking Status: never (02/22/2010) PMH-FH-SH reviewed for relevance  Physical Exam  General:  Well-developed,well-nourished,in no acute distress; alert,appropriate and cooperative throughout examination Mouth:  Oral mucosa and oropharynx without lesions or exudates.  Teeth in good repair. Lungs:  Normal respiratory effort, chest expands symmetrically. Lungs are clear to auscultation, no crackles or wheezes. Heart:  normal rate and regular rhythm.   Extremities:  trace edema feet and lower legs bilaterally.   Impression & Recommendations:  Problem # 1:  ACUTE BRONCHITIS (ICD-466.0)  Her updated medication list for this problem includes:   Doxycycline Hyclate 100 Mg Caps (Doxycycline hyclate) ..... One by mouth two times a day for 7 days  Complete Medication List: 1)  Lithium Carbonate 450 Mg Cr-tabs (Lithium carbonate) .Marland Kitchen.. 1 in the evening 2)  Effexor 75 Mg Tabs (Venlafaxine hcl) .... 2 tabs every day 3)  Budeprion Sr 150 Mg Tb12 (Bupropion hcl) .... Once daily 4)  Levoxyl 75 Mcg Tabs (Levothyroxine sodium) .... Once daily 5)  Eql Omeprazole 20 Mg Tbec (Omeprazole) .... Once daily 6)  Adult Aspirin Ec Low Strength 81 Mg Tbec (Aspirin) .... Once daily 7)  Ferrous Sulfate 324 Mg Tbec (Ferrous sulfate) .... Once daily 8)  Anti-oxidant Tabs (Multiple vitamin) .... Once daily 9)  Astelin 137 Mcg/spray Soln (Azelastine hcl) .Marland Kitchen.. 1-2 sprays per nostril two times a day 10)  Aricept 23 Mg Tabs (Donepezil hcl) .... Once daily 11)  Ambien Cr 6.25 Mg Cr-tabs (Zolpidem tartrate) .... Once daily at bedtime 12)  Zyprexa 5 Mg Tabs (Olanzapine) .... Once daily at bedtime 13)  Vitamin D 2000 Unit Tabs (Cholecalciferol) .... Once daily 14)  Allopurinol 100 Mg Tabs (Allopurinol) .... Once daily as needed 15)  Furosemide 20 Mg Tabs (Furosemide) .... One by mouth daily 16)  Klor-con 10 10 Meq Cr-tabs (Potassium chloride) .... One by mouth daily 17)  Oxygen  .... 2 liter rest; 3liters exertion 18)  Doxycycline Hyclate 100 Mg Caps (Doxycycline hyclate) .... One by mouth two times a day for 7 days  Patient Instructions: 1)  Acute Bronchitis symptoms for less then 10 days are not  helped by antibiotics. Take over the counter cough medications. Call if no improvement in 5-7 days, sooner if  increasing cough, fever, or new symptoms ( shortness of breath, chest pain) .  Prescriptions: DOXYCYCLINE HYCLATE 100 MG CAPS (DOXYCYCLINE HYCLATE) one by mouth two times a day for 7 days  #14 x 0   Entered and Authorized by:   Evelena Peat MD   Signed by:   Evelena Peat MD on 03/02/2010   Method used:   Print then Give to Patient   RxID:    [id number]    Orders Added: 1)  Est. Patient Level III [30865]

## 2010-04-13 NOTE — Progress Notes (Signed)
Summary: Pt requesting referral to Dr Delford Field, previousely saw Dr Sherene Sires  Phone Note Call from Patient Call back at [facility] Phone [phone]   Caller: Patient Call For: April Peat MD Summary of Call: Pt called requesting referral to Dr Delford Field.  She has seen Dr Sherene Sires in the past post hospital stay (please see earlier phone note from pulmonary office in EMR forwarded to you FYI). Initial call taken by: Sid Falcon LPN,  February 20, 2010 3:39 PM  Follow-up for Phone Call        already ordered.  She has seen Dr Sherene Sires previously and pulmonary will have to approve any change of physicians. Follow-up by: April Peat MD,  February 20, 2010 6:20 PM

## 2010-04-19 NOTE — Letter (Signed)
Summary: [facility]  [facility]   Imported By: Maryln Gottron 04/13/2010 14:46:01  _____________________________________________________________________  External Attachment:    Type:   Image     Comment:   External Document

## 2010-05-31 ENCOUNTER — Encounter: Payer: Self-pay | Admitting: Critical Care Medicine

## 2010-06-02 ENCOUNTER — Telehealth: Payer: Self-pay | Admitting: Family Medicine

## 2010-06-02 NOTE — Telephone Encounter (Signed)
Pt requesting to switch pcp from [facility] to [facility].

## 2010-06-05 ENCOUNTER — Encounter: Payer: Self-pay | Admitting: Critical Care Medicine

## 2010-06-05 ENCOUNTER — Ambulatory Visit (INDEPENDENT_AMBULATORY_CARE_PROVIDER_SITE_OTHER): Payer: Medicare Other | Admitting: Critical Care Medicine

## 2010-06-05 VITALS — BP 122/58 | HR 80 | Temp 97.1°F | Ht <= 58 in | Wt 203.6 lb

## 2010-06-05 DIAGNOSIS — J449 Chronic obstructive pulmonary disease, unspecified: Secondary | ICD-10-CM

## 2010-06-05 DIAGNOSIS — J984 Other disorders of lung: Secondary | ICD-10-CM

## 2010-06-05 DIAGNOSIS — R609 Edema, unspecified: Secondary | ICD-10-CM

## 2010-06-05 NOTE — Patient Instructions (Addendum)
Stay on spiriva Overnight sleep oximetry with download per [facility] will be ordered, I will call with results Return 3 months

## 2010-06-05 NOTE — Progress Notes (Signed)
Subjective:    Patient ID: [name], female    DOB: [birth year], 76 y.o.   MRN: [id number]  Shortness of Breath This is a chronic problem. The current episode started more than 1 year ago. The problem occurs intermittently. The problem has been rapidly improving. Associated symptoms include leg swelling. Pertinent negatives include no chest pain, fever, headaches, orthopnea or PND. Her past medical history is significant for chronic lung disease. There is no history of DVT.    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 Her complaints dramatically improved off of Macrodantin.     06/05/2010 Now off all oxygen.  Sleeps in a chair.  Pt is noncompliant with oxygen therapy.  Pt is better with Spiriva daily.  No real cough, no wheeze is noted. Pt overall feels she is doing well. Pt denies any significant sore throat, nasal congestion or excess secretions, fever, chills, sweats, unintended weight loss, pleurtic or exertional chest pain, orthopnea PND Pt denies any increase in rescue therapy over baseline, denies waking up needing it or having any early am or nocturnal exacerbations of coughing/wheezing/or dyspnea. Pt also denies any obvious fluctuation in symptoms with  weather or environmental change or other alleviating or aggravating factors  Past Medical History  Diagnosis Date  . GERD (gastroesophageal reflux disease)   . Hypothyroidism   . Bipolar 1 disorder   . Obesity   . Anemia   . Pulmonary nodule   . Tremors of nervous system   . Memory loss   . Urinary incontinence      Family History  Problem Relation Age of Onset  . Stroke Mother      History   Social History  . Marital Status: Married    Spouse Name: N/A    Number of Children: 2  . Years of Education: N/A   Occupational History  . Retired Dipl[employer] Services operational officer    Social History Main Topics  . Smoking  status: Never Smoker   . Smokeless tobacco: Never Used  . Alcohol Use: No  . Drug Use: No  . Sexually Active: Not on file   Other Topics Concern  . Not on file   Social History Narrative  . No narrative on file     Allergies  Allergen Reactions  . Amoxicillin     REACTION: unknown  . Nitrofurantoin     REACTION: severe reaction, throat swelling  . Omeprazole-Sodium Bicarbonate     REACTION: unknown  . Sulfasalazine     REACTION: unknown     Outpatient Prescriptions Prior to Visit  Medication Sig Dispense Refill  . aspirin 81 MG tablet Take 81 mg by mouth daily.        Marland Kitchen buPROPion (WELLBUTRIN SR) 150 MG 12 hr tablet Take 150 mg by mouth daily.        . Cholecalciferol (VITAMIN D) 2000 UNITS CAPS Take 2,000 capsules by mouth daily.        Marland Kitchen donepezil (ARICEPT) 10 MG tablet 2 once daily        . ferrous sulfate 324 (65 FE) MG TBEC Take by mouth daily.        Marland Kitchen levothyroxine (SYNTHROID, LEVOTHROID) 75 MCG tablet Take 75 mcg by mouth daily.        Marland Kitchen lithium (ESKALITH) 450 MG CR tablet Once tablet three times week and 3/4 tablet the other 4 days      .  Multiple Vitamin (MULTIVITAMIN) capsule Take 1 capsule by mouth daily.        Marland Kitchen OLANZapine (ZYPREXA) 5 MG tablet Take 5 mg by mouth at bedtime.        Marland Kitchen omeprazole (PRILOSEC) 20 MG capsule Take 20 mg by mouth daily.        Marland Kitchen tiotropium (SPIRIVA) 18 MCG inhalation capsule Place 18 mcg into inhaler and inhale daily.        Marland Kitchen venlafaxine (EFFEXOR) 75 MG tablet Take 75 mg by mouth 2 (two) times daily.        Marland Kitchen zolpidem (AMBIEN CR) 6.25 MG CR tablet Take 6.25 mg by mouth at bedtime as needed.        Marland Kitchen allopurinol (ZYLOPRIM) 100 MG tablet Take 100 mg by mouth daily as needed.       . furosemide (LASIX) 20 MG tablet Take 20 mg by mouth daily.        . potassium chloride SA (K-DUR,KLOR-CON) 20 MEQ tablet Take 20 mEq by mouth 2 (two) times daily.            Review of Systems  Constitutional: Negative for fever.  Respiratory: Positive  for shortness of breath.   Cardiovascular: Positive for leg swelling. Negative for chest pain, orthopnea and PND.  Neurological: Negative for headaches.       Objective:   Physical Exam Gen: obese elderly F, in no distress,  normal affect  ENT: No lesions,  mouth clear,  oropharynx clear, no postnasal drip  Neck: No JVD, no TMG, no carotid bruits  Lungs: No use of accessory muscles, no dullness to percussion, distant BS Cardiovascular: RRR, heart sounds normal, no murmur or gallops, 2+  peripheral edema  Abdomen: soft and NT, no HSM,  BS normal  Musculoskeletal: No deformities, no cyanosis or clubbing  Neuro: alert, non focal  Skin: Warm, no lesions or rashes    No images are attached to the encounter.        Assessment & Plan:   BRONCHITIS, OBSTRUCTIVE CHRONIC Improved on spiriva but not compliant with oxygen therapy  Plan  ONO with download with [facility] on RA Cont spiriva daily No diuretics for now     Updated Medication List Outpatient Encounter Prescriptions as of 06/05/2010  Medication Sig Dispense Refill  . aspirin 81 MG tablet Take 81 mg by mouth daily.        Marland Kitchen buPROPion (WELLBUTRIN SR) 150 MG 12 hr tablet Take 150 mg by mouth daily.        . Cholecalciferol (VITAMIN D) 2000 UNITS CAPS Take 2,000 capsules by mouth daily.        Marland Kitchen donepezil (ARICEPT) 10 MG tablet 2 once daily        . ferrous sulfate 324 (65 FE) MG TBEC Take by mouth daily.        Marland Kitchen levothyroxine (SYNTHROID, LEVOTHROID) 75 MCG tablet Take 75 mcg by mouth daily.        Marland Kitchen lithium (ESKALITH) 450 MG CR tablet Once tablet three times week and 3/4 tablet the other 4 days      . Multiple Vitamin (MULTIVITAMIN) capsule Take 1 capsule by mouth daily.        Marland Kitchen OLANZapine (ZYPREXA) 5 MG tablet Take 5 mg by mouth at bedtime.        Marland Kitchen omeprazole (PRILOSEC) 20 MG capsule Take 20 mg by mouth daily.        Marland Kitchen tiotropium (SPIRIVA) 18 MCG inhalation capsule Place 18  mcg into inhaler and inhale daily.          Marland Kitchen venlafaxine (EFFEXOR) 75 MG tablet Take 75 mg by mouth 2 (two) times daily.        Marland Kitchen zolpidem (AMBIEN CR) 6.25 MG CR tablet Take 6.25 mg by mouth at bedtime as needed.        Marland Kitchen allopurinol (ZYLOPRIM) 100 MG tablet Take 100 mg by mouth daily as needed.       Marland Kitchen DISCONTD: furosemide (LASIX) 20 MG tablet Take 20 mg by mouth daily.        Marland Kitchen DISCONTD: potassium chloride SA (K-DUR,KLOR-CON) 20 MEQ tablet Take 20 mEq by mouth 2 (two) times daily.

## 2010-06-05 NOTE — Telephone Encounter (Signed)
I would be happy for Dr Rodena Medin to see- if he is willing.

## 2010-06-05 NOTE — Assessment & Plan Note (Addendum)
Improved on spiriva but not compliant with oxygen therapy  Plan  ONO with download with [facility] on RA Cont spiriva daily No diuretics for now

## 2010-06-07 ENCOUNTER — Ambulatory Visit (INDEPENDENT_AMBULATORY_CARE_PROVIDER_SITE_OTHER): Payer: Medicare Other | Admitting: Internal Medicine

## 2010-06-07 ENCOUNTER — Encounter: Payer: Self-pay | Admitting: Internal Medicine

## 2010-06-07 VITALS — BP 112/78 | HR 90 | Temp 98.2°F | Wt 203.0 lb

## 2010-06-07 DIAGNOSIS — R600 Localized edema: Secondary | ICD-10-CM

## 2010-06-07 DIAGNOSIS — R609 Edema, unspecified: Secondary | ICD-10-CM

## 2010-06-07 MED ORDER — FUROSEMIDE 20 MG PO TABS: ORAL_TABLET | ORAL | Status: DC

## 2010-06-07 NOTE — Telephone Encounter (Signed)
ok 

## 2010-06-13 ENCOUNTER — Encounter: Payer: Self-pay | Admitting: Family Medicine

## 2010-06-14 ENCOUNTER — Telehealth: Payer: Self-pay | Admitting: Critical Care Medicine

## 2010-06-14 DIAGNOSIS — J449 Chronic obstructive pulmonary disease, unspecified: Secondary | ICD-10-CM

## 2010-06-14 NOTE — Assessment & Plan Note (Signed)
Improved lung function ONO RA normal 06/07/10 Plan Pt advised to d/c oxygent at night, she will call back in 2-3 weeks with a report at which time if no changes off oxygen, will d/c oxygen from [facility]

## 2010-06-14 NOTE — Telephone Encounter (Signed)
Plan to stop oxygen with ONO on RA normal 06/07/10 ,  If no changes in symptoms off oxygen after 2-3 weeks.

## 2010-06-16 ENCOUNTER — Encounter: Payer: Self-pay | Admitting: Internal Medicine

## 2010-06-16 NOTE — Progress Notes (Signed)
  Subjective:    Patient ID: [name], female    DOB: Jul 29, 1934, 76 y.o.   MRN: [id number]  HPI Pt presents to clinic for evaluation of foot pain and swelling. Notes chronic 3+month h/o bilateral LE swelling not associated with worsening dyspnea or cp. May improve with elevation and finds compression hose difficult. Reviewed nl TSH 12/11, echo with nl EF 11/11 and nl chem7 and BNP 01/07/11. Previously attempted lasix and denies side effect.    Review of Systems  Constitutional: Negative for fever and chills.  Respiratory: Negative for cough, shortness of breath and wheezing.   Cardiovascular: Positive for leg swelling. Negative for chest pain and palpitations.  Musculoskeletal: Negative for joint swelling and arthralgias.  Skin: Negative for color change, pallor and wound.  Neurological: Negative for tremors and weakness.       Objective:   Physical Exam  Nursing note and vitals reviewed. Constitutional: She appears well-developed and well-nourished. No distress.  HENT:  Head: Normocephalic and atraumatic.  Right Ear: External ear normal.  Left Ear: External ear normal.  Eyes: Conjunctivae are normal. Right eye exhibits no discharge. Left eye exhibits no discharge. No scleral icterus.  Neck: Neck supple. No JVD present.  Cardiovascular: Normal rate and regular rhythm.  Exam reveals no gallop and no friction rub.   Murmur heard. Pulmonary/Chest: Effort normal and breath sounds normal. No respiratory distress. She has no wheezes. She has no rales.  Musculoskeletal: She exhibits edema.       +2 bilateral le swelling. No palpable cords or calf tenderness.  Neurological: She is alert.  Skin: Skin is warm and dry. She is not diaphoretic. No erythema.  Psychiatric: She has a normal mood and affect.          Assessment & Plan:

## 2010-06-16 NOTE — Assessment & Plan Note (Signed)
Discussed with pt likely contribution from venous insufficiency. No obvious evidence of hypervolemia. Begin lasix prn swelling. Refer to lymphedema clinic.

## 2010-06-19 LAB — DIFFERENTIAL
Basophils Absolute: 0 10*3/uL (ref 0.0–0.1)
Basophils Relative: 0 % (ref 0–1)
Eosinophils Absolute: 0.4 10*3/uL (ref 0.0–0.7)
Eosinophils Relative: 7 % — ABNORMAL HIGH (ref 0–5)
Monocytes Absolute: 0.4 10*3/uL (ref 0.1–1.0)

## 2010-06-19 LAB — BASIC METABOLIC PANEL
BUN: 13 mg/dL (ref 6–23)
CO2: 25 mEq/L (ref 19–32)
Calcium: 9.6 mg/dL (ref 8.4–10.5)
Chloride: 107 mEq/L (ref 96–112)
Creatinine, Ser: 1.1 mg/dL (ref 0.4–1.2)
GFR calc Af Amer: 59 mL/min — ABNORMAL LOW (ref >60)
GFR calc non Af Amer: 49 mL/min — ABNORMAL LOW (ref >60)
Glucose, Bld: 133 mg/dL — ABNORMAL HIGH (ref 70–99)
Potassium: 4.1 mEq/L (ref 3.5–5.1)
Sodium: 141 mEq/L (ref 135–145)

## 2010-06-19 LAB — CBC
HCT: 32.1 % — ABNORMAL LOW (ref 36.0–46.0)
Hemoglobin: 10.5 g/dL — ABNORMAL LOW (ref 12.0–15.0)
MCHC: 32.7 g/dL (ref 30.0–36.0)
RDW: 14.9 % (ref 11.5–15.5)

## 2010-06-19 LAB — LITHIUM LEVEL: Lithium Lvl: 0.83 mEq/L (ref 0.80–1.40)

## 2010-06-28 ENCOUNTER — Telehealth: Payer: Self-pay | Admitting: Critical Care Medicine

## 2010-06-28 DIAGNOSIS — J449 Chronic obstructive pulmonary disease, unspecified: Secondary | ICD-10-CM

## 2010-06-28 NOTE — Telephone Encounter (Signed)
Ok to d/c oxygen

## 2010-06-28 NOTE — Telephone Encounter (Signed)
Per phone note from 06/14/10 by Dr. Delford Field, Plan to stop oxygen with ONO on RA normal 06/07/10 , If no changes in symptoms off oxygen after 2-3 weeks.  Called, spoke with pt. States she was told to call back in 2 wks to let us know if she was sure she wanted to d/c o2.  Pt states she is sure she wants the o2 d/c'd.  Dr. Delford Field, ok to send order to d/c o2?

## 2010-06-28 NOTE — Telephone Encounter (Signed)
Pt aware order will be sent to d/c o2.  She verbalized understanding.  Order sent to [facility] to have [facility] d/c o2.

## 2010-07-06 ENCOUNTER — Encounter: Payer: Self-pay | Admitting: Critical Care Medicine

## 2010-07-10 ENCOUNTER — Encounter: Payer: Self-pay | Admitting: Internal Medicine

## 2010-07-10 ENCOUNTER — Ambulatory Visit (INDEPENDENT_AMBULATORY_CARE_PROVIDER_SITE_OTHER): Payer: Medicare Other | Admitting: Internal Medicine

## 2010-07-10 DIAGNOSIS — M79606 Pain in leg, unspecified: Secondary | ICD-10-CM | POA: Insufficient documentation

## 2010-07-10 DIAGNOSIS — M79609 Pain in unspecified limb: Secondary | ICD-10-CM

## 2010-07-10 DIAGNOSIS — E039 Hypothyroidism, unspecified: Secondary | ICD-10-CM

## 2010-07-10 DIAGNOSIS — R609 Edema, unspecified: Secondary | ICD-10-CM

## 2010-07-10 NOTE — Assessment & Plan Note (Signed)
Stable. Consider TSH with next visit

## 2010-07-10 NOTE — Assessment & Plan Note (Signed)
Stable. Proceed with lymphedema clinic. Off Lasix

## 2010-07-10 NOTE — Progress Notes (Signed)
  Subjective:    Patient ID: [name], female    DOB: 1934/05/02, 76 y.o.   MRN: [id number]  HPI Pt presents to clinic for followup of multiple medical problems. Suffers from chronic lower extremity leg swelling at last clinic was referred to the lymphedema clinic. Has pending appointment. Stopped Lasix due to increased urination. Compression hose no longer fit. Has had no wound ulceration or drainage from leg. Does note with prolonged walking such as with shopping that her knees vs. her legs hurt bilaterally. She is unsure completely if this is of joint pain or lower extremity pain with her leg swelling. Has had no injury or trauma to the knees. Denies decreased range of motion. Is followed by psychiatry and takes lithium on regular basis. States she recently had a lithium level reportedly normal obtained by psychiatry. History of hypothyroidism a TSH repeat was normal December 2011. No other complaints.  Reviewed past medical history, medications and allergies.    Review of Systems  Constitutional: Negative for fever and chills.  Respiratory: Negative for cough and shortness of breath.   Genitourinary: Positive for frequency.  Musculoskeletal: Positive for myalgias and arthralgias. Negative for joint swelling and gait problem.  Skin: Negative for color change, pallor, rash and wound.  Neurological: Positive for tremors. Negative for weakness.  Psychiatric/Behavioral: Negative for agitation. The patient is not nervous/anxious.         Objective:   Physical Exam  Nursing note and vitals reviewed. Constitutional: She appears well-developed and well-nourished. No distress.  HENT:  Head: Normocephalic and atraumatic.  Right Ear: External ear normal.  Left Ear: External ear normal.  Nose: Nose normal.  Eyes: Conjunctivae are normal. No scleral icterus.  Musculoskeletal:       Knee exam reveals no erythema warmth or effusion. Bilateral knees nontender. Able to weight-bear and ambulate  without assistance. No crepitus noted.  Neurological: She is alert.  Skin: Skin is warm and dry. She is not diaphoretic.       Mild hyperpigmentation of bilateral lower extremities. No wound ulceration or warmth. No drainage noted.          Assessment & Plan:

## 2010-07-10 NOTE — Assessment & Plan Note (Signed)
Patient to pay attention with next occurrence of pain in his lower extremities in general or if true knee pain. If knee pain recommend bilateral knee radiograph. Avoid anti-inflammatories because of lithium use. Use Tylenol when necessary and followup if no improvement or worsening.
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
## 2010-07-25 NOTE — Assessment & Plan Note (Signed)
[name]                             PULMONARY OFFICE NOTE   NAME:[name]                       MRN:          [id number]  DATE:12/16/2006                            DOB:          [birth year]    HISTORY:  This is a delightful 76 year old white female, never smoker,  who was seen during her recent hospitalization for acute respiratory  distress with bilateral infiltrates on chest x-ray associated with a  right pulmonary nodule which was nonspecific.  I obtained the additional  history during the hospital that she had surreptiitiously been on  Macrodantin prior to the onset of her symptoms which were diagnosed  clinically as a pneumonia.  However, she had no purulent sputum or  fever.   I stopped all antibiotics and I asked her not to take Macrodantin  anymore, she comes in all smiles today with no significant symptoms of  dyspnea or cough, fevers, chills, sweats, orthopnea, PND or leg  swelling.  For full info on medications, please see face sheet column  dated December 16, 2006.   PHYSICAL EXAMINATION:  She is a pleasant, ambulatory, moderately obese  white female relatively short in stature.  She is afebrile, normal vital  signs.  HEENT:  Unremarkable, oropharynx clear.  LUNGS:  Fields perfectly clear bilaterally to auscultation and  percussion with no cough on inspiratory maneuvers or wheezes on  expiration.  Regular rhythm without murmur, gallop, rub.  ABDOMEN:  Obese but otherwise benign.  EXTREMITIES:  Warm without calf tenderness, cyanosis, clubbing, edema or  nodules.   Chest x-ray pending.   Heme saturation 98% on room air.   IMPRESSION:  1. Classic Macrodantin pulmonary toxicity suggested by the prompt      improvement off of Macrodantin in terms of all of her pulmonary      symptoms and infiltrates.  2. This does not necessarily get [place] off the hook in terms of the      pulmonary nodule but since it is located in the right  middle lobe      and she is a never smoker, statistically it is very unlikely that      this is a malignant neoplasm.  I have recommended followup chest x-      ray today and then again in 3 months and if there is macroscopic      evidence of a nodule developing then I would go directly to      excisional biopsy.  Otherwise a followup limited CT scan at 6      months could be considered.    [name]. April Sires, MD, [facility]  Electronically Signed   MBW/MedQ  DD: 12/16/2006  DT: 12/17/2006  Job #: [id number]   cc:   [name], M.D.
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
## 2010-07-25 NOTE — Assessment & Plan Note (Signed)
OFFICE VISIT   [name], [name]  DOB:  1934-10-15                                       07/19/2008  CHART#:[id number]   The patient is a 76 year old female with severe venous insufficiency of  the left leg, having been previously evaluated 2 years ago by Dr.  Fabienne Bruns.  She has chronic swelling in the left leg as well as an  aching and throbbing discomfort in the left thigh and calf which has  been progressively getting worse over the last several years.  She has  been having increasing swelling in the left ankle as well as prominent  veins, not only in the ankle area but also in the distal thigh and  lateral knee and calf area.  She did have an episode of thrombophlebitis  several years ago.  She is not certain which leg this occurred in.  She  has had ligation and stripping of the greater saphenous in the right leg  15 years ago in [facility].  She has worn elastic compression  stockings in the past (long-leg pantyhose) as well as tried elevation of  the legs but has not done this recently.  She has no history of stasis  ulcers or bleeding from varicosities, but states the pain is becoming  unbearable and affecting her daily living in multiple ways.   PHYSICAL EXAMINATION:  Today, her blood pressure is 129/71, heart rate  76 respirations 14.  She is an obese female who is no apparent distress,  alert and oriented x3.  Abdomen:  Soft, nontender with no palpable  masses.  She has 3+ femoral, popliteal and dorsalis pedis pulses  bilaterally.  Right leg has evidence of previous venous surgery with  multiple transverse incisions in the thigh and calf.  There is moderate  edema in both legs down to the ankle area.  Left leg has some prominent  reticular veins in the left ankle as well as some prominent varicosities  in the left lateral calf around the knee and up into the thigh, coursing  obliquely over to the greater saphenous system in the proximal  thigh.  There is no hyperpigmentation or active ulceration noted.   Venous duplex exam was performed today and compared to the one in 2008.  Today's study reveals gross reflux at the left saphenofemoral junction,  which was not present 2 years, as well as reflux down the prominent  lateral branch and involving the greater saphenous vein to the knee.  Deep venous system is widely patent.   I discussed with her the fact that her discomfort is coming from a  combination of venous insufficiency and that the obesity is affecting  this.  She also has chronic edema which is being aggravated by the  venous insufficiency.  I think we should treat her with long-leg elastic  compression stockings (20-mm - 30-mm gradient) as well as elevation and  analgesics and have her return in 3 months to see if there has been any  improvement.  If not, I would recommend laser ablation of the left great  saphenous vein to include the area proximal to the origin of the lateral  branch which refluxes.  She will also have stab phlebectomies at the  same setting and will probably require to 2 sessions of sclerotherapy  following this.  She will return  in 3 months for further examination.   Quita Skye Hart [place], M.D.  Electronically Signed   JDL/MEDQ  D:  07/19/2008  T:  07/19/2008  Job:  [id number]   cc:   Bertram Millard. Hyacinth Meeker, M.D.

## 2010-07-25 NOTE — Op Note (Signed)
NAMESUNI, JARNAGIN                ACCOUNT NO.:  [id number]   MEDICAL RECORD NO.:  [id number]          PATIENT TYPE:  AMB   LOCATION:  SDC                           FACILITY:  [facility]   PHYSICIAN:  Carrington Clamp, M.D. DATE OF BIRTH:  1934-04-02   DATE OF PROCEDURE:  01/21/2008  DATE OF DISCHARGE:                               OPERATIVE REPORT   PREOPERATIVE DIAGNOSIS:  Postmenopausal bleeding.   POSTOPERATIVE DIAGNOSIS:  Postmenopausal bleeding.   PROCEDURE:  Dilation and curettage with hysteroscopy.   SURGEON:  Carrington Clamp, MD   ANESTHESIA:  General LMA.   FINDINGS:  Scant endometrium and hysteroscopy deficit of 80 mL.   SPECIMEN:  Endometrial curettings.   ESTIMATED BLOOD LOSS:  Minimal.   IV FLUIDS:  500 mL.   URINE OUTPUT:  Not measured.   COMPLICATIONS:  None.   MEDICATIONS:  None.   COUNTS:  Correct x3.   TECHNIQUE:  After adequate LMA anesthesia was achieved, the patient was  prepped and draped in usual sterile fashion in dorsal lithotomy  position.  The bladder was emptied with a red rubber catheter and  speculum was placed in the vagina.  This cervix was grasped with a  single-tooth tenaculum and the cervix dilated without complication with  Shawnie Pons dilators.  The hysteroscope was passed into the uterine cavity and  the ostia were noted.  No polyps,  fibroids, or any other abnormalities of the cavity were seen.  Curettage  was then performed.  Tissue sent to pathology.  Once a second look was  obtained and there were no abnormalities seen, all instruments withdrawn  from the vagina and the patient returned to recovery room in stable  condition.      Carrington Clamp, M.D.  Electronically Signed     MH/MEDQ  D:  01/21/2008  T:  01/21/2008  Job:  [id number]
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
## 2010-07-25 NOTE — Procedures (Signed)
LOWER EXTREMITY VENOUS REFLUX EXAM   INDICATION:  Left leg swelling/reflux.   EXAM:  Using color-flow imaging and pulse Doppler spectral analysis, the  left common femoral, superficial femoral, popliteal, posterior tibial,  greater and lesser saphenous veins are evaluated.  There is evidence  suggesting deep venous insufficiency at the left common femoral vein  level.   The left saphenofemoral junction is not competent.  The left GSV is not  competent with the caliber as described below.   The left proximal short saphenous vein demonstrates competency.   GSV Diameter (used if found to be incompetent only)                                            Right    Left  Proximal Greater Saphenous Vein           cm       0.76 cm  Proximal-to-mid-thigh                     cm       cm  Mid thigh                                 cm       0.68 cm  Mid-distal thigh                          cm       cm  Distal thigh                              cm       0.71 cm  Knee                                      cm       0.31 cm   IMPRESSION:  1. Left greater saphenous vein reflux is identified, as described      above and on the attached work sheet.  2. The left greater saphenous vein is not tortuous.  3. The deep venous system is not competent at the common femoral vein      level.  4. The left lesser saphenous vein is competent.   ___________________________________________  Quita Skye. Hart Rochester, M.D.   CH/MEDQ  D:  07/19/2008  T:  07/19/2008  Job:  [id number]
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
## 2010-09-04 ENCOUNTER — Ambulatory Visit: Payer: Medicare Other | Admitting: Critical Care Medicine

## 2010-09-22 ENCOUNTER — Encounter: Payer: Self-pay | Admitting: Critical Care Medicine

## 2010-09-22 ENCOUNTER — Ambulatory Visit (INDEPENDENT_AMBULATORY_CARE_PROVIDER_SITE_OTHER): Payer: Medicare Other | Admitting: Critical Care Medicine

## 2010-09-22 VITALS — BP 124/68 | HR 74 | Temp 97.4°F | Ht <= 58 in

## 2010-09-22 DIAGNOSIS — J449 Chronic obstructive pulmonary disease, unspecified: Secondary | ICD-10-CM

## 2010-09-22 MED ORDER — TIOTROPIUM BROMIDE MONOHYDRATE 18 MCG IN CAPS: 18.0000 ug | ORAL_CAPSULE | Freq: Every day | RESPIRATORY_TRACT | Status: DC

## 2010-09-22 NOTE — Progress Notes (Signed)
  Subjective:    Patient ID: [name], female    DOB: 10/28/1934, 76 y.o.   MRN: [id number]  HPI    Review of Systems     Objective:   Physical Exam        Assessment & Plan:

## 2010-09-22 NOTE — Patient Instructions (Signed)
No change in medications. Return in         4 months 

## 2010-09-22 NOTE — Progress Notes (Signed)
Subjective:    Patient ID: [name], female    DOB: [birth year], 76 y.o.   MRN: [id number]  HPI  76 y.o.  09/22/2010 Lymphedema issues for which pt now has support hose. Dyspnea is ok  With Spiriva Pt denies any significant sore throat, nasal congestion or excess secretions, fever, chills, sweats, unintended weight loss, pleurtic or exertional chest pain, orthopnea PND, or leg swelling Pt denies any increase in rescue therapy over baseline, denies waking up needing it or having any early am or nocturnal exacerbations of coughing/wheezing/or dyspnea. Pt also denies any obvious fluctuation in symptoms with  weather or environmental change or other alleviating or aggravating factors  Past Medical History  Diagnosis Date  . GERD (gastroesophageal reflux disease)   . Hypothyroidism   . Bipolar 1 disorder   . Obesity   . Anemia   . Pulmonary nodule   . Tremors of nervous system   . Memory loss   . Urinary incontinence      Family History  Problem Relation Age of Onset  . Stroke Mother      History   Social History  . Marital Status: Married    Spouse Name: N/A    Number of Children: 2  . Years of Education: N/A   Occupational History  . Retired [employer] operational officer    Social History Main Topics  . Smoking status: Never Smoker   . Smokeless tobacco: Never Used  . Alcohol Use: No  . Drug Use: No  . Sexually Active: Not on file   Other Topics Concern  . Not on file   Social History Narrative  . No narrative on file     Allergies  Allergen Reactions  . Amoxicillin     REACTION: unknown  . Macrobid   . Nitrofurantoin     REACTION: severe reaction, throat swelling  . Omeprazole-Sodium Bicarbonate     REACTION: unknown  . Sulfasalazine     REACTION: unknown     Outpatient Prescriptions Prior to Visit  Medication Sig Dispense Refill  . allopurinol (ZYLOPRIM) 100 MG tablet Take 100 mg by mouth daily as needed.       Marland Kitchen aspirin 81 MG tablet Take 81 mg by mouth  daily.        Marland Kitchen buPROPion (WELLBUTRIN SR) 150 MG 12 hr tablet Take 150 mg by mouth daily.        . Cholecalciferol (VITAMIN D) 2000 UNITS CAPS Take 2,000 capsules by mouth daily.        . ferrous sulfate 324 (65 FE) MG TBEC Take by mouth daily.        Marland Kitchen levothyroxine (SYNTHROID, LEVOTHROID) 75 MCG tablet Take 75 mcg by mouth daily.        Marland Kitchen lithium (ESKALITH) 450 MG CR tablet Once tablet three times week and 3/4 tablet the other 4 days      . Multiple Vitamin (MULTIVITAMIN) capsule Take 1 capsule by mouth daily.        Marland Kitchen OLANZapine (ZYPREXA) 5 MG tablet Take 5 mg by mouth at bedtime.        Marland Kitchen venlafaxine (EFFEXOR) 75 MG tablet Take 150 mg by mouth daily.       Marland Kitchen zolpidem (AMBIEN CR) 6.25 MG CR tablet Take 6.25 mg by mouth at bedtime.       Marland Kitchen tiotropium (SPIRIVA) 18 MCG inhalation capsule Place 18 mcg into inhaler and inhale daily.        . furosemide (LASIX) 20 MG  tablet 1-2 po qam prn leg swelling  60 tablet  2  . donepezil (ARICEPT) 10 MG tablet 2 once daily        . omeprazole (PRILOSEC) 20 MG capsule Take 20 mg by mouth daily.             Review of Systems Constitutional:   No  weight loss, night sweats,  Fevers, chills, fatigue, lassitude. HEENT:   No headaches,  Difficulty swallowing,  Tooth/dental problems,  Sore throat,                No sneezing, itching, ear ache, nasal congestion, post nasal drip,   CV:  No chest pain,  Orthopnea, PND,+ swelling in lower extremities, no anasarca, dizziness, palpitations  GI  No heartburn, indigestion, abdominal pain, nausea, vomiting, diarrhea, change in bowel habits, loss of appetite  Resp: Notes  shortness of breath with exertion not at rest.  No excess mucus, no productive cough,  No non-productive cough,  No coughing up of blood.  No change in color of mucus.  No wheezing.  No chest wall deformity  Skin: no rash or lesions.  GU: no dysuria, change in color of urine, no urgency or frequency.  No flank pain.  MS:  No joint pain or  swelling.  No decreased range of motion.  No back pain.  Psych:  No change in mood or affect. No depression or anxiety.  No memory loss.     Objective:   Physical Exam Filed Vitals:   09/22/10 0926  BP: 124/68  Pulse: 74  Temp: 97.4 F (36.3 C)  TempSrc: Oral  Height: 4\' 9"  (1.448 m)  SpO2: 100%    Gen: Pleasant, well-nourished, in no distress,  normal affect obese   ENT: No lesions,  mouth clear,  oropharynx clear, no postnasal drip  Neck: No JVD, no TMG, no carotid bruits  Lungs: No use of accessory muscles, no dullness to percussion, clear without rales or rhonchi  Cardiovascular: RRR, heart sounds normal, no murmur or gallops, 1+ peripheral edema, support hose in place  Abdomen: soft and NT, no HSM,  BS normal  Musculoskeletal: No deformities, no cyanosis or clubbing  Neuro: alert, non focal  Skin: Warm, no lesions or rashes        Assessment & Plan:   BRONCHITIS, OBSTRUCTIVE CHRONIC Chronic airflow obstruction with chronic hypoxemic respiratory failure Improved on spiriva ONO RA 06/07/10:  Normal, no desat  Stable on 7/12 OV Plan No change in inhaled or maintenance medications. spiriva Return in  4 months     Updated Medication List Outpatient Encounter Prescriptions as of 09/22/2010  Medication Sig Dispense Refill  . allopurinol (ZYLOPRIM) 100 MG tablet Take 100 mg by mouth daily as needed.       Marland Kitchen aspirin 81 MG tablet Take 81 mg by mouth daily.        Marland Kitchen buPROPion (WELLBUTRIN SR) 150 MG 12 hr tablet Take 150 mg by mouth daily.        . Cholecalciferol (VITAMIN D) 2000 UNITS CAPS Take 2,000 capsules by mouth daily.        Marland Kitchen donepezil (ARICEPT) 23 MG TABS tablet Take 23 mg by mouth daily.        . ferrous sulfate 324 (65 FE) MG TBEC Take by mouth daily.        Marland Kitchen levothyroxine (SYNTHROID, LEVOTHROID) 75 MCG tablet Take 75 mcg by mouth daily.        Marland Kitchen lithium (ESKALITH) 450  MG CR tablet Once tablet three times week and 3/4 tablet the other 4 days        . Multiple Vitamin (MULTIVITAMIN) capsule Take 1 capsule by mouth daily.        Marland Kitchen NAMENDA 10 MG tablet Once daily      . OLANZapine (ZYPREXA) 5 MG tablet Take 5 mg by mouth at bedtime.        . primidone (MYSOLINE) 50 MG tablet 2 tablets once daily      . SF 5000 PLUS 1.1 % CREA Use as directed      . tiotropium (SPIRIVA) 18 MCG inhalation capsule Place 1 capsule (18 mcg total) into inhaler and inhale daily.  30 capsule  6  . venlafaxine (EFFEXOR) 75 MG tablet Take 150 mg by mouth daily.       Marland Kitchen zolpidem (AMBIEN CR) 6.25 MG CR tablet Take 6.25 mg by mouth at bedtime.       Marland Kitchen DISCONTD: tiotropium (SPIRIVA) 18 MCG inhalation capsule Place 18 mcg into inhaler and inhale daily.        . furosemide (LASIX) 20 MG tablet 1-2 po qam prn leg swelling  60 tablet  2  . DISCONTD: donepezil (ARICEPT) 10 MG tablet 2 once daily        . DISCONTD: omeprazole (PRILOSEC) 20 MG capsule Take 20 mg by mouth daily.

## 2010-09-23 NOTE — Assessment & Plan Note (Signed)
Chronic airflow obstruction with chronic hypoxemic respiratory failure Improved on spiriva ONO RA 06/07/10:  Normal, no desat  Stable on 7/12 OV Plan No change in inhaled or maintenance medications. spiriva Return in  4 months

## 2010-10-08 ENCOUNTER — Other Ambulatory Visit: Payer: Self-pay | Admitting: Critical Care Medicine

## 2010-11-06 ENCOUNTER — Ambulatory Visit: Payer: Medicare Other | Admitting: Internal Medicine

## 2010-11-06 ENCOUNTER — Ambulatory Visit (INDEPENDENT_AMBULATORY_CARE_PROVIDER_SITE_OTHER): Payer: Medicare Other | Admitting: Internal Medicine

## 2010-11-06 ENCOUNTER — Encounter: Payer: Self-pay | Admitting: Internal Medicine

## 2010-11-06 DIAGNOSIS — Z79899 Other long term (current) drug therapy: Secondary | ICD-10-CM

## 2010-11-06 DIAGNOSIS — E039 Hypothyroidism, unspecified: Secondary | ICD-10-CM

## 2010-11-06 DIAGNOSIS — R609 Edema, unspecified: Secondary | ICD-10-CM

## 2010-11-06 DIAGNOSIS — J310 Chronic rhinitis: Secondary | ICD-10-CM

## 2010-11-06 LAB — TSH: TSH: 2.367 u[IU]/mL (ref 0.350–4.500)

## 2010-11-06 LAB — CBC
HCT: 34.5 % — ABNORMAL LOW (ref 36.0–46.0)
MCHC: 29.9 g/dL — ABNORMAL LOW (ref 30.0–36.0)
RDW: 14.9 % (ref 11.5–15.5)

## 2010-11-06 MED ORDER — FLUTICASONE PROPIONATE 50 MCG/ACT NA SUSP: 2.0000 | Freq: Every day | NASAL | Status: DC

## 2010-11-07 LAB — HEPATIC FUNCTION PANEL
AST: 12 U/L (ref 0–37)
Bilirubin, Direct: 0.1 mg/dL (ref 0.0–0.3)
Total Bilirubin: 0.3 mg/dL (ref 0.3–1.2)

## 2010-11-07 LAB — BASIC METABOLIC PANEL
BUN: 15 mg/dL (ref 6–23)
Potassium: 4.3 mEq/L (ref 3.5–5.3)
Sodium: 142 mEq/L (ref 135–145)

## 2010-11-12 DIAGNOSIS — J310 Chronic rhinitis: Secondary | ICD-10-CM | POA: Insufficient documentation

## 2010-11-12 NOTE — Progress Notes (Signed)
  Subjective:    Patient ID: [name], female    DOB: 08/09/1934, 76 y.o.   MRN: [id number]  HPI Pt presents to clinic for followup of multiple medical problems. Notes PP belching without GERD sx's or abdominal pain. Lymphedema improved and stable. Complains of rhinorrhea described as clear. No f/c. H/o hypothyroidism with stable wt. No other complaints.  Past Medical History  Diagnosis Date  . GERD (gastroesophageal reflux disease)   . Hypothyroidism   . Bipolar 1 disorder   . Obesity   . Anemia   . Pulmonary nodule   . Tremors of nervous system   . Memory loss   . Urinary incontinence    Past Surgical History  Procedure Date  . D&c x2     reports that she has never smoked. She has never used smokeless tobacco. She reports that she does not drink alcohol or use illicit drugs. family history includes Stroke in her mother. Allergies  Allergen Reactions  . Amoxicillin     REACTION: unknown  . Macrobid   . Nitrofurantoin     REACTION: severe reaction, throat swelling  . Omeprazole-Sodium Bicarbonate     REACTION: unknown  . Sulfasalazine     REACTION: unknown     Review of Systems see hpi     Objective:   Physical Exam  Physical Exam  Nursing note and vitals reviewed. Constitutional: Appears well-developed and well-nourished. No distress.  HENT:  Head: Normocephalic and atraumatic.  Right Ear: External ear normal.  Left Ear: External ear normal.  Eyes: Conjunctivae are normal. No scleral icterus.  Neck: Neck supple. Carotid bruit is not present.  Cardiovascular: Normal rate, regular rhythm and normal heart sounds.  Exam reveals no gallop and no friction rub.   No murmur heard. Pulmonary/Chest: Effort normal and breath sounds normal. No respiratory distress. He has no wheezes. no rales.  Lymphadenopathy:    He has no cervical adenopathy.  Neurological:Alert.  Skin: Skin is warm and dry. Not diaphoretic.  Psychiatric: Has a normal mood and affect.          Assessment & Plan:

## 2010-11-12 NOTE — Assessment & Plan Note (Signed)
Attempt allegra and flonase.

## 2010-11-12 NOTE — Assessment & Plan Note (Signed)
Improved with lymphedema clinic tx.

## 2010-11-12 NOTE — Assessment & Plan Note (Signed)
Obtain tsh and free t4 

## 2010-12-12 LAB — CBC
Hemoglobin: 11.1 — ABNORMAL LOW
MCHC: 32.1
RBC: 3.91

## 2010-12-21 LAB — BASIC METABOLIC PANEL
BUN: 11
Calcium: 9.3
Chloride: 105
Chloride: 107
GFR calc Af Amer: >60
Glucose, Bld: 113 — ABNORMAL HIGH
Potassium: 3.7
Sodium: 139

## 2010-12-21 LAB — CULTURE, BLOOD (ROUTINE X 2): Culture: NO GROWTH

## 2010-12-21 LAB — CBC
HCT: 31.8 — ABNORMAL LOW
HCT: 31.8 — ABNORMAL LOW
HCT: 36.9
Hemoglobin: 10.6 — ABNORMAL LOW
Hemoglobin: 12.1
MCHC: 33.4
MCV: 85.9
RBC: 3.71 — ABNORMAL LOW
RBC: 3.72 — ABNORMAL LOW
RBC: 4.27
RDW: 14.7 — ABNORMAL HIGH
WBC: 11.8 — ABNORMAL HIGH
WBC: 18 — ABNORMAL HIGH

## 2010-12-21 LAB — POCT I-STAT CREATININE
Creatinine, Ser: 1.3 — ABNORMAL HIGH
Operator id: 285841

## 2010-12-21 LAB — I-STAT 8, (EC8 V) (CONVERTED LAB)
Acid-base deficit: 4 — ABNORMAL HIGH
Bicarbonate: 21.5
HCT: 42
Operator id: 285841
pCO2, Ven: 39.3 — ABNORMAL LOW
pH, Ven: 7.347 — ABNORMAL HIGH

## 2010-12-21 LAB — URINALYSIS, ROUTINE W REFLEX MICROSCOPIC
Bilirubin Urine: NEGATIVE
Glucose, UA: NEGATIVE
Hgb urine dipstick: NEGATIVE
Ketones, ur: NEGATIVE
Nitrite: NEGATIVE
Protein, ur: NEGATIVE
Specific Gravity, Urine: 1.018
Urobilinogen, UA: 0.2
pH: 6

## 2010-12-21 LAB — COMPREHENSIVE METABOLIC PANEL
ALT: 51 — ABNORMAL HIGH
AST: 69 — ABNORMAL HIGH
CO2: 23
Calcium: 8.9
GFR calc Af Amer: 46 — ABNORMAL LOW
GFR calc non Af Amer: 38 — ABNORMAL LOW
Sodium: 135

## 2010-12-21 LAB — POCT I-STAT 3, ART BLOOD GAS (G3+)
Operator id: 281201
TCO2: 27
pCO2 arterial: 46.2 — ABNORMAL HIGH
pH, Arterial: 7.358

## 2010-12-21 LAB — CARDIAC PANEL(CRET KIN+CKTOT+MB+TROPI)
CK, MB: 1.2
CK, MB: 2.8
Relative Index: 0.5
Relative Index: 0.7
Total CK: 408 — ABNORMAL HIGH
Troponin I: 0.03

## 2010-12-21 LAB — D-DIMER, QUANTITATIVE

## 2010-12-21 LAB — SEDIMENTATION RATE: Sed Rate: 93 — ABNORMAL HIGH

## 2010-12-21 LAB — DIFFERENTIAL
Eosinophils Relative: 1
Lymphocytes Relative: 2 — ABNORMAL LOW
Lymphs Abs: 0.3 — ABNORMAL LOW
Monocytes Absolute: 1 — ABNORMAL HIGH
Monocytes Relative: 6

## 2010-12-21 LAB — B-NATRIURETIC PEPTIDE (CONVERTED LAB): Pro B Natriuretic peptide (BNP): 214 — ABNORMAL HIGH

## 2010-12-21 LAB — POCT CARDIAC MARKERS
CKMB, poc: 1.4
Myoglobin, poc: >500
Operator id: 285841
Troponin i, poc: <0.05

## 2010-12-21 LAB — LIPID PANEL: VLDL: 26

## 2010-12-21 LAB — URINE CULTURE
Colony Count: NO GROWTH
Culture: NO GROWTH

## 2010-12-21 LAB — TSH: TSH: 0.272 — ABNORMAL LOW

## 2010-12-21 LAB — LITHIUM LEVEL: Lithium Lvl: 0.65 — ABNORMAL LOW

## 2010-12-21 LAB — CK TOTAL AND CKMB (NOT AT ARMC): Total CK: 344 — ABNORMAL HIGH

## 2011-02-05 ENCOUNTER — Ambulatory Visit: Payer: Medicare Other | Admitting: Critical Care Medicine

## 2011-02-13 ENCOUNTER — Ambulatory Visit: Payer: Medicare Other | Admitting: Critical Care Medicine

## 2011-02-20 ENCOUNTER — Ambulatory Visit: Payer: Medicare Other | Admitting: Critical Care Medicine

## 2011-03-04 ENCOUNTER — Other Ambulatory Visit: Payer: Self-pay | Admitting: Family Medicine

## 2011-03-07 ENCOUNTER — Other Ambulatory Visit: Payer: Self-pay | Admitting: Internal Medicine

## 2011-03-07 NOTE — Telephone Encounter (Signed)
Rx refill sent to pharmacy. 

## 2011-04-06 ENCOUNTER — Ambulatory Visit: Payer: Medicare Other | Admitting: Critical Care Medicine

## 2011-04-23 ENCOUNTER — Encounter: Payer: Self-pay | Admitting: Critical Care Medicine

## 2011-04-23 ENCOUNTER — Ambulatory Visit (INDEPENDENT_AMBULATORY_CARE_PROVIDER_SITE_OTHER): Payer: Medicare Other | Admitting: Critical Care Medicine

## 2011-04-23 DIAGNOSIS — J449 Chronic obstructive pulmonary disease, unspecified: Secondary | ICD-10-CM

## 2011-04-23 MED ORDER — ACLIDINIUM BROMIDE 400 MCG/ACT IN AEPB: 1.0000 | INHALATION_SPRAY | Freq: Two times a day (BID) | RESPIRATORY_TRACT | Status: DC

## 2011-04-23 NOTE — Patient Instructions (Signed)
Stop Spiriva Start Tudorza one puff twice daily Return 2 months for recheck on Tudorza

## 2011-04-23 NOTE — Assessment & Plan Note (Signed)
Chronic airflow obstruction with chronic hypoxemic respiratory failure Improved on spiriva in the past but not getting consistent effect now ONO RA 06/07/10:  Normal, no desat  Plan Trial Tudorza, samples given D/C spiriva

## 2011-04-23 NOTE — Progress Notes (Signed)
Subjective:    Patient ID: [name], female    DOB: 09/29/1934, 77 y.o.   MRN: [id number]  HPI   04/23/2011  Since last ov,  Able to walk with exertion.  Has lymphedema and arthritis.  Has support hose Pt denies any significant sore throat, nasal congestion or excess secretions, fever, chills, sweats, unintended weight loss, pleurtic or exertional chest pain, orthopnea PND, or leg swelling Pt denies any increase in rescue therapy over baseline, denies waking up needing it or having any early am or nocturnal exacerbations of coughing/wheezing/or dyspnea. Pt also denies any obvious fluctuation in symptoms with  weather or environmental change or other alleviating or aggravating factors    Past Medical History  Diagnosis Date  . GERD (gastroesophageal reflux disease)   . Hypothyroidism   . Bipolar 1 disorder   . Obesity   . Anemia   . Pulmonary nodule   . Tremors of nervous system   . Memory loss   . Urinary incontinence      Family History  Problem Relation Age of Onset  . Stroke Mother      History   Social History  . Marital Status: Married    Spouse Name: N/A    Number of Children: 2  . Years of Education: N/A   Occupational History  . Retired [employer] operational officer    Social History Main Topics  . Smoking status: Never Smoker   . Smokeless tobacco: Never Used  . Alcohol Use: No  . Drug Use: No  . Sexually Active: Not on file   Other Topics Concern  . Not on file   Social History Narrative  . No narrative on file     Allergies  Allergen Reactions  . Amoxicillin     REACTION: unknown  . Macrobid   . Nitrofurantoin     REACTION: severe reaction, throat swelling  . Omeprazole-Sodium Bicarbonate     REACTION: unknown  . Sulfasalazine     REACTION: unknown     Outpatient Prescriptions Prior to Visit  Medication Sig Dispense Refill  . aspirin 81 MG tablet Take 81 mg by mouth daily.        Marland Kitchen buPROPion (WELLBUTRIN SR) 150 MG 12 hr tablet Take 150 mg by  mouth daily.        . Cholecalciferol (VITAMIN D) 2000 UNITS CAPS Take 2,000 capsules by mouth daily.        Marland Kitchen donepezil (ARICEPT) 23 MG TABS tablet Take 23 mg by mouth daily.        . ferrous sulfate 324 (65 FE) MG TBEC Take by mouth daily.        Marland Kitchen LEVOXYL 75 MCG tablet take 1 tablet by mouth once daily  90 tablet  2  . lithium (ESKALITH) 450 MG CR tablet Once tablet three times week and 3/4 tablet the other 4 days      . OLANZapine (ZYPREXA) 5 MG tablet Take 5 mg by mouth at bedtime.        . primidone (MYSOLINE) 50 MG tablet 2 tablets once daily      . SF 5000 PLUS 1.1 % CREA Use as directed      . venlafaxine (EFFEXOR) 75 MG tablet Take 150 mg by mouth daily.       Marland Kitchen tiotropium (SPIRIVA) 18 MCG inhalation capsule Place 1 capsule (18 mcg total) into inhaler and inhale daily.  30 capsule  6  . allopurinol (ZYLOPRIM) 100 MG tablet Take 100 mg by  mouth daily as needed.       . fluticasone (FLONASE) 50 MCG/ACT nasal spray Place 2 sprays into the nose daily.  16 g  3  . LEVOXYL 75 MCG tablet take 1 tablet by mouth once daily  90 tablet  0  . Multiple Vitamin (MULTIVITAMIN) capsule Take 1 capsule by mouth daily.        Marland Kitchen zolpidem (AMBIEN CR) 6.25 MG CR tablet Take 6.25 mg by mouth at bedtime.            Review of Systems  Constitutional:   No  weight loss, night sweats,  Fevers, chills, fatigue, lassitude. HEENT:   No headaches,  Difficulty swallowing,  Tooth/dental problems,  Sore throat,                No sneezing, itching, ear ache, nasal congestion, post nasal drip,   CV:  No chest pain,  Orthopnea, PND,+ +swelling in lower extremities, no anasarca, dizziness, palpitations  GI  No heartburn, indigestion, abdominal pain, nausea, vomiting, diarrhea, change in bowel habits, loss of appetite  Resp: Notes  shortness of breath with exertion not at rest.  No excess mucus, no productive cough,  No non-productive cough,  No coughing up of blood.  No change in color of mucus.  No wheezing.  No  chest wall deformity  Skin: no rash or lesions.  GU: no dysuria, change in color of urine, no urgency or frequency.  No flank pain.  MS:  No joint pain or swelling.  No decreased range of motion.  No back pain.  Psych:  No change in mood or affect. No depression or anxiety.  No memory loss.     Objective:   Physical Exam  Filed Vitals:   04/23/11 1529  BP: 112/82  Pulse: 85  Temp: 97.6 F (36.4 C)  TempSrc: Oral  Height: 4\' 9"  (1.448 m)  SpO2: 96%    Gen: Pleasant, well-nourished, in no distress,  normal affect obese   ENT: No lesions,  mouth clear,  oropharynx clear, no postnasal drip  Neck: No JVD, no TMG, no carotid bruits  Lungs: No use of accessory muscles, no dullness to percussion, clear without rales or rhonchi  Cardiovascular: RRR, heart sounds normal, no murmur or gallops, 1+ peripheral edema, support hose in place  Abdomen: soft and NT, no HSM,  BS normal  Musculoskeletal: No deformities, no cyanosis or clubbing  Neuro: alert, non focal  Skin: Warm, no lesions or rashes        Assessment & Plan:   BRONCHITIS, OBSTRUCTIVE CHRONIC Chronic airflow obstruction with chronic hypoxemic respiratory failure Improved on spiriva in the past but not getting consistent effect now ONO RA 06/07/10:  Normal, no desat  Plan Trial Tudorza, samples given D/C spiriva      Updated Medication List Outpatient Encounter Prescriptions as of 04/23/2011  Medication Sig Dispense Refill  . aspirin 81 MG tablet Take 81 mg by mouth daily.        Marland Kitchen buPROPion (WELLBUTRIN SR) 150 MG 12 hr tablet Take 150 mg by mouth daily.        . CELEBREX 200 MG capsule Take by mouth as needed.      . Cholecalciferol (VITAMIN D) 2000 UNITS CAPS Take 2,000 capsules by mouth daily.        Marland Kitchen donepezil (ARICEPT) 23 MG TABS tablet Take 23 mg by mouth daily.        . ferrous sulfate 324 (65 FE) MG TBEC  Take by mouth daily.        . FUROSEMIDE PO Take by mouth daily.      Marland Kitchen LEVOXYL 75 MCG  tablet take 1 tablet by mouth once daily  90 tablet  2  . lithium (ESKALITH) 450 MG CR tablet Once tablet three times week and 3/4 tablet the other 4 days      . Multiple Vitamins-Minerals (CENTRUM SILVER PO) Take by mouth daily.      Marland Kitchen OLANZapine (ZYPREXA) 5 MG tablet Take 5 mg by mouth at bedtime.        . primidone (MYSOLINE) 50 MG tablet 2 tablets once daily      . SF 5000 PLUS 1.1 % CREA Use as directed      . venlafaxine (EFFEXOR) 75 MG tablet Take 150 mg by mouth daily.       Marland Kitchen DISCONTD: tiotropium (SPIRIVA) 18 MCG inhalation capsule Place 1 capsule (18 mcg total) into inhaler and inhale daily.  30 capsule  6  . Aclidinium Bromide (TUDORZA PRESSAIR) 400 MCG/ACT AEPB Inhale 1 puff into the lungs 2 (two) times daily.  1 each  6  . DISCONTD: allopurinol (ZYLOPRIM) 100 MG tablet Take 100 mg by mouth daily as needed.       Marland Kitchen DISCONTD: fluticasone (FLONASE) 50 MCG/ACT nasal spray Place 2 sprays into the nose daily.  16 g  3  . DISCONTD: LEVOXYL 75 MCG tablet take 1 tablet by mouth once daily  90 tablet  0  . DISCONTD: LOTEMAX 0.5 % ophthalmic suspension       . DISCONTD: Multiple Vitamin (MULTIVITAMIN) capsule Take 1 capsule by mouth daily.        Marland Kitchen DISCONTD: PATADAY 0.2 % SOLN       . DISCONTD: zolpidem (AMBIEN CR) 6.25 MG CR tablet Take 6.25 mg by mouth at bedtime.

## 2011-05-07 ENCOUNTER — Ambulatory Visit: Payer: Medicare Other | Admitting: Internal Medicine

## 2011-05-08 ENCOUNTER — Telehealth: Payer: Self-pay | Admitting: Critical Care Medicine

## 2011-05-08 MED ORDER — TIOTROPIUM BROMIDE MONOHYDRATE 18 MCG IN CAPS: 18.0000 ug | ORAL_CAPSULE | Freq: Every day | RESPIRATORY_TRACT | Status: DC

## 2011-05-08 NOTE — Telephone Encounter (Signed)
This is ok to stop [place] and start spiriva back, offer her samples and call in refill

## 2011-05-08 NOTE — Telephone Encounter (Signed)
Spoke with pt. She states that she is unable to use tudorza inhaler. She states that when the button is green and she goes to inhale it never turns red. I advised that she may not be taking a deep enough or quick enough breathe. I asked her to get her inhaler out so I could try and talk her through it, but she states that she has been trying every day and simply has given up. She states would like to go back to taking spiriva. Please advise, thanks!

## 2011-05-08 NOTE — Telephone Encounter (Signed)
Pt advised of recs and refill sent. Kodi Steil, CMA  

## 2011-05-11 ENCOUNTER — Encounter: Payer: Self-pay | Admitting: Internal Medicine

## 2011-05-11 ENCOUNTER — Ambulatory Visit (INDEPENDENT_AMBULATORY_CARE_PROVIDER_SITE_OTHER): Payer: Medicare Other | Admitting: Internal Medicine

## 2011-05-11 DIAGNOSIS — E2839 Other primary ovarian failure: Secondary | ICD-10-CM

## 2011-05-11 DIAGNOSIS — M79609 Pain in unspecified limb: Secondary | ICD-10-CM

## 2011-05-11 DIAGNOSIS — Z79899 Other long term (current) drug therapy: Secondary | ICD-10-CM

## 2011-05-11 DIAGNOSIS — M79606 Pain in leg, unspecified: Secondary | ICD-10-CM

## 2011-05-11 DIAGNOSIS — J310 Chronic rhinitis: Secondary | ICD-10-CM

## 2011-05-11 DIAGNOSIS — E039 Hypothyroidism, unspecified: Secondary | ICD-10-CM

## 2011-05-11 DIAGNOSIS — D649 Anemia, unspecified: Secondary | ICD-10-CM

## 2011-05-11 LAB — CBC WITH DIFFERENTIAL/PLATELET
Hemoglobin: 10.3 g/dL — ABNORMAL LOW (ref 12.0–15.0)
Lymphocytes Relative: 21 % (ref 12–46)
Lymphs Abs: 1.7 10*3/uL (ref 0.7–4.0)
MCV: 93.6 fL (ref 78.0–100.0)
Neutrophils Relative %: 66 % (ref 43–77)
Platelets: 324 10*3/uL (ref 150–400)
RBC: 3.6 MIL/uL — ABNORMAL LOW (ref 3.87–5.11)
WBC: 8 10*3/uL (ref 4.0–10.5)

## 2011-05-11 LAB — BASIC METABOLIC PANEL
Chloride: 105 mEq/L (ref 96–112)
Potassium: 4.6 mEq/L (ref 3.5–5.3)

## 2011-05-11 LAB — TSH: TSH: 2.416 u[IU]/mL (ref 0.350–4.500)

## 2011-05-11 LAB — FERRITIN: Ferritin: 46 ng/mL (ref 10–291)

## 2011-05-11 MED ORDER — MONTELUKAST SODIUM 10 MG PO TABS: 10.0000 mg | ORAL_TABLET | Freq: Every day | ORAL | Status: DC

## 2011-05-11 MED ORDER — DICLOFENAC SODIUM 1 % TD GEL: TRANSDERMAL | Status: DC

## 2011-05-11 NOTE — Progress Notes (Signed)
  Subjective:    Patient ID: [name], female    DOB: 02-Apr-1934, 77 y.o.   MRN: [id number]  HPI Pt presents to clinic for followup of multiple medical problems. Notes chronic clear rhinorrhea often associated with irritated eyes. Has attempted and failed 2 antihistamines and flonase including both in combination. Has chronic left knee pain followed by orthopedics. Received ?steroid injxn without improvement. Taking celebrex without gi adverse effect but without improvement of knee pain. Does take lithium under supervision of psychiatry who she states is checking drug levels.   Past Medical History  Diagnosis Date  . GERD (gastroesophageal reflux disease)   . Hypothyroidism   . Bipolar 1 disorder   . Obesity   . Anemia   . Pulmonary nodule   . Tremors of nervous system   . Memory loss   . Urinary incontinence    Past Surgical History  Procedure Date  . D&c x2     reports that she has never smoked. She has never used smokeless tobacco. She reports that she does not drink alcohol or use illicit drugs. family history includes Stroke in her mother. Allergies  Allergen Reactions  . Amoxicillin     REACTION: unknown  . Macrobid   . Nitrofurantoin     REACTION: severe reaction, throat swelling  . Omeprazole-Sodium Bicarbonate     REACTION: unknown  . Sulfasalazine     REACTION: unknown      Review of Systems see hpi     Objective:   Physical Exam Physical Exam  Nursing note and vitals reviewed. Constitutional: Appears well-developed and well-nourished. No distress.  HENT:  Head: Normocephalic and atraumatic.  Right Ear: External ear normal.  Left Ear: External ear normal.  Eyes: Conjunctivae are normal. No scleral icterus.  Neck: Neck supple. Carotid bruit is not present.  Cardiovascular: Normal rate, regular rhythm and normal heart sounds.  Exam reveals no gallop and no friction rub.   3/6 sm murmur. Pulmonary/Chest: Effort normal and breath sounds normal. No  respiratory distress. He has no wheezes. no rales.  Lymphadenopathy:    He has no cervical adenopathy.  Neurological:Alert.  Skin: Skin is warm and dry. Not diaphoretic.  Psychiatric: Has a normal mood and affect.         Assessment & Plan:  .

## 2011-05-19 NOTE — Assessment & Plan Note (Signed)
Failed AH's and inhaled steroids. Attempt singulair.

## 2011-05-19 NOTE — Assessment & Plan Note (Signed)
Stop celebrex. Attempt voltaren gel. Made aware of nsaid potentiating effect of po nsaids. Pt to discuss with psychiatry

## 2011-05-19 NOTE — Assessment & Plan Note (Signed)
Obtain tsh and free t4 

## 2011-05-31 ENCOUNTER — Inpatient Hospital Stay: Admission: RE | Admit: 2011-05-31 | Payer: Medicare Other | Source: Ambulatory Visit

## 2011-06-05 ENCOUNTER — Telehealth: Payer: Self-pay | Admitting: Internal Medicine

## 2011-06-05 NOTE — Telephone Encounter (Signed)
Call to patient to schedule dexa , patient states she does not want to have  dexa scan

## 2011-06-25 ENCOUNTER — Ambulatory Visit: Payer: Medicare Other | Admitting: Critical Care Medicine

## 2011-08-20 ENCOUNTER — Ambulatory Visit: Payer: Medicare Other | Admitting: Internal Medicine

## 2011-08-24 ENCOUNTER — Telehealth: Payer: Self-pay | Admitting: Internal Medicine

## 2011-08-24 ENCOUNTER — Ambulatory Visit (INDEPENDENT_AMBULATORY_CARE_PROVIDER_SITE_OTHER): Payer: Medicare Other | Admitting: Internal Medicine

## 2011-08-24 ENCOUNTER — Encounter: Payer: Self-pay | Admitting: Internal Medicine

## 2011-08-24 VITALS — BP 110/74 | HR 72 | Temp 97.5°F | Resp 18 | Ht <= 58 in | Wt 197.0 lb

## 2011-08-24 DIAGNOSIS — D649 Anemia, unspecified: Secondary | ICD-10-CM

## 2011-08-24 DIAGNOSIS — E039 Hypothyroidism, unspecified: Secondary | ICD-10-CM

## 2011-08-24 DIAGNOSIS — M79609 Pain in unspecified limb: Secondary | ICD-10-CM

## 2011-08-24 DIAGNOSIS — J309 Allergic rhinitis, unspecified: Secondary | ICD-10-CM

## 2011-08-24 DIAGNOSIS — E669 Obesity, unspecified: Secondary | ICD-10-CM

## 2011-08-24 DIAGNOSIS — Z79899 Other long term (current) drug therapy: Secondary | ICD-10-CM

## 2011-08-24 DIAGNOSIS — M79606 Pain in leg, unspecified: Secondary | ICD-10-CM

## 2011-08-24 NOTE — Patient Instructions (Signed)
Please schedule fasting labs prior to next visit Cbc-anemia, [id number]-v58.69, lipid-chol screening and tsh/free t4-hypothyroidism

## 2011-08-24 NOTE — Progress Notes (Signed)
  Subjective:    Patient ID: [name], female    DOB: 12-Nov-1934, 77 y.o.   MRN: [id number]  HPI Pt presents to clinic for followup of multiple medical problems. Notes singulair helps AR some. Taking celebrex prn while on lithium. Not taking voltaren gel. Wt down 5lbs.   Past Medical History  Diagnosis Date  . GERD (gastroesophageal reflux disease)   . Hypothyroidism   . Bipolar 1 disorder   . Obesity   . Anemia   . Pulmonary nodule   . Tremors of nervous system   . Memory loss   . Urinary incontinence    Past Surgical History  Procedure Date  . D&c x2     reports that she has never smoked. She has never used smokeless tobacco. She reports that she does not drink alcohol or use illicit drugs. family history includes Stroke in her mother. Allergies  Allergen Reactions  . Amoxicillin     REACTION: unknown  . Nitrofurantoin     REACTION: severe reaction, throat swelling  . Nitrofurantoin Monohyd Macro   . Omeprazole-Sodium Bicarbonate     REACTION: unknown  . Sulfasalazine     REACTION: unknown     Review of Systems see hpi     Objective:   Physical Exam  Nursing note and vitals reviewed. Constitutional: She appears well-developed and well-nourished. No distress.  HENT:  Head: Normocephalic and atraumatic.  Right Ear: External ear normal.  Left Ear: External ear normal.  Eyes: Conjunctivae are normal. No scleral icterus.  Neck: Neck supple.  Cardiovascular: Normal rate and regular rhythm.   Murmur heard.  Systolic murmur is present with a grade of 3/6  Pulmonary/Chest: Effort normal and breath sounds normal. No respiratory distress. She has no wheezes. She has no rales.  Skin: Skin is warm and dry. She is not diaphoretic.  Psychiatric: She has a normal mood and affect.          Assessment & Plan:

## 2011-08-24 NOTE — Telephone Encounter (Signed)
Lab orders entered for October 2013. 

## 2011-09-05 ENCOUNTER — Telehealth: Payer: Self-pay | Admitting: *Deleted

## 2011-09-05 NOTE — Telephone Encounter (Signed)
Fax received from [facility] on [address] stating  Levoxyl is not available, and to please advise as to what to use in its place. Last fill was 06/04/2011 for qty of 90

## 2011-09-05 NOTE — Telephone Encounter (Signed)
Closest equivalent they have. Sure they can assist with this

## 2011-09-06 NOTE — Telephone Encounter (Signed)
Call placed to  [facility] at [phone], voice message left for pharmacist to dispense closet equivalent per Dr Rodena Medin instructions. Message advised pharmacy to call back with medication that would be dispensed for patient.

## 2011-09-07 MED ORDER — LEVOTHYROXINE SODIUM 75 MCG PO TABS: 75.0000 ug | ORAL_TABLET | Freq: Every day | ORAL | Status: DC

## 2011-09-07 NOTE — Telephone Encounter (Signed)
Call placed to pharmacy at [phone]. Spoke with Raiford Noble the pharmacist he stated he received the voice message left. Patient was dispensed levothyroxine. No further action is required.

## 2011-09-08 NOTE — Assessment & Plan Note (Signed)
Reminded of potential for nsaids to potentiate lithium. Lithium levels followed by psychiatry. Discouraged nsaid use. Pt will discuss further with psychiatry. States understanding.

## 2011-09-08 NOTE — Assessment & Plan Note (Signed)
Continue singulair 

## 2011-09-09 ENCOUNTER — Other Ambulatory Visit: Payer: Self-pay | Admitting: Internal Medicine

## 2011-09-29 ENCOUNTER — Encounter (HOSPITAL_COMMUNITY): Payer: Self-pay | Admitting: Emergency Medicine

## 2011-09-29 ENCOUNTER — Emergency Department (HOSPITAL_COMMUNITY)
Admission: EM | Admit: 2011-09-29 | Discharge: 2011-09-29 | Disposition: A | Discharge status: Home / Self Care | Payer: Medicare Other | Attending: Emergency Medicine | Admitting: Emergency Medicine

## 2011-09-29 DIAGNOSIS — Z79899 Other long term (current) drug therapy: Secondary | ICD-10-CM | POA: Insufficient documentation

## 2011-09-29 DIAGNOSIS — Z7982 Long term (current) use of aspirin: Secondary | ICD-10-CM | POA: Insufficient documentation

## 2011-09-29 DIAGNOSIS — K219 Gastro-esophageal reflux disease without esophagitis: Secondary | ICD-10-CM | POA: Insufficient documentation

## 2011-09-29 DIAGNOSIS — E039 Hypothyroidism, unspecified: Secondary | ICD-10-CM | POA: Insufficient documentation

## 2011-09-29 DIAGNOSIS — T18108A Unspecified foreign body in esophagus causing other injury, initial encounter: Secondary | ICD-10-CM | POA: Insufficient documentation

## 2011-09-29 DIAGNOSIS — K222 Esophageal obstruction: Secondary | ICD-10-CM

## 2011-09-29 DIAGNOSIS — IMO0002 Reserved for concepts with insufficient information to code with codable children: Secondary | ICD-10-CM | POA: Insufficient documentation

## 2011-09-29 DIAGNOSIS — E669 Obesity, unspecified: Secondary | ICD-10-CM | POA: Insufficient documentation

## 2011-09-29 MED ORDER — SODIUM CHLORIDE 0.9 % IV SOLN: INTRAVENOUS | Status: DC

## 2011-09-29 NOTE — ED Provider Notes (Signed)
History     CSN: [id number]  Arrival date & time 09/29/11  1820   First MD Initiated Contact with Patient 09/29/11 1908      Chief Complaint  Patient presents with  . Airway Obstruction    eating pork chop, choked, was assessed by EMTs who felt safe for husband to transport  . piece of meat stuck in throat     (Consider location/radiation/quality/duration/timing/severity/associated sxs/prior treatment) HPI Patient complaining of piece of pork chop stuck in throat.  Occurred just prior to arrival. Patient breathing well but states unable to swallow water although handling secretions.  PMD Dr. Ty Hilts, no gi.  Past Medical History  Diagnosis Date  . GERD (gastroesophageal reflux disease)   . Hypothyroidism   . Bipolar 1 disorder   . Obesity   . Anemia   . Pulmonary nodule   . Tremors of nervous system   . Memory loss   . Urinary incontinence     Past Surgical History  Procedure Date  . D&c x2     Family History  Problem Relation Age of Onset  . Stroke Mother     History  Substance Use Topics  . Smoking status: Never Smoker   . Smokeless tobacco: Never Used  . Alcohol Use: No    OB History    Grav Para Term Preterm Abortions TAB SAB Ect Mult Living                  Review of Systems  All other systems reviewed and are negative.    Allergies  Amoxicillin; Nitrofurantoin; Nitrofurantoin monohyd macro; Omeprazole-sodium bicarbonate; and Sulfasalazine  Home Medications   Current Outpatient Rx  Name Route Sig Dispense Refill  . ASPIRIN 81 MG PO TABS Oral Take 81 mg by mouth daily.      . BUPROPION HCL ER (SR) 150 MG PO TB12 Oral Take 150 mg by mouth daily.      . CELEBREX 200 MG PO CAPS Oral Take by mouth as needed.    Marland Kitchen VITAMIN D 2000 UNITS PO CAPS Oral Take 2,000 capsules by mouth daily.      . DONEPEZIL HCL 23 MG PO TABS Oral Take 23 mg by mouth daily.      Marland Kitchen FERROUS SULFATE 324 (65 FE) MG PO TBEC Oral Take by mouth daily.      Marland Kitchen LEVOTHYROXINE  SODIUM 75 MCG PO TABS Oral Take 1 tablet (75 mcg total) by mouth daily. 90 tablet 1    Rx provided and received by pharmacist.  . LITHIUM CARBONATE ER 450 MG PO TBCR  Once tablet three times week and 3/4 tablet the other 4 days    . MONTELUKAST SODIUM 10 MG PO TABS  take 1 tablet by mouth at bedtime 30 tablet 3  . CENTRUM SILVER PO Oral Take by mouth daily.    Marland Kitchen OLANZAPINE 5 MG PO TABS Oral Take 10 mg by mouth at bedtime.     Marland Kitchen PRIMIDONE 50 MG PO TABS  2 tablets once daily    . TIOTROPIUM BROMIDE MONOHYDRATE 18 MCG IN CAPS Inhalation Place 1 capsule (18 mcg total) into inhaler and inhale daily. 30 capsule 6    BP 131/54  Pulse 101  Temp 97.8 F (36.6 C) (Oral)  SpO2 93%  Physical Exam  Nursing note and vitals reviewed. Constitutional: She appears well-developed and well-nourished.    ED Course  Procedures (including critical care time)  Labs Reviewed - No data to display No results  found.   No diagnosis found.    MDM  Patient states that it feels like the meat has gone down. She is now able to swallow water without difficulty. She is discharged home in improved condition. She is given a referral to [facility].      Hilario Quarry, MD 09/29/11 2026

## 2011-09-29 NOTE — ED Notes (Signed)
Pt refused lab for BMP stated she is going home.  RN aware.

## 2011-09-29 NOTE — ED Notes (Signed)
One hour prior to arrival, was eating supper and choked on piece of pork chop. Tried cough it up w/o success. Pt is able to swallow oral secretions. Assessed by EMS PTA but husband transported to ED.

## 2011-09-29 NOTE — ED Notes (Signed)
Pt was able to sip water without difficulty. Pt states, "I think it went down, because it's not hurting me. It would hurt before."

## 2011-09-29 NOTE — ED Notes (Signed)
Bed:WA17<BR> Expected date:<BR> Expected time:<BR> Means of arrival:<BR> Comments:<BR> hold

## 2011-09-29 NOTE — ED Notes (Signed)
Was eating a pork chop at home and became "choked"- was not able to swallow own saliva at home, is able to swallow saliva now. States becomes choked on water. Airway clear, resp unlabored

## 2011-10-12 ENCOUNTER — Encounter: Payer: Self-pay | Admitting: Critical Care Medicine

## 2011-10-12 ENCOUNTER — Ambulatory Visit (INDEPENDENT_AMBULATORY_CARE_PROVIDER_SITE_OTHER): Payer: Medicare Other | Admitting: Critical Care Medicine

## 2011-10-12 VITALS — BP 126/58 | HR 90 | Temp 97.8°F | Ht <= 58 in | Wt 194.0 lb

## 2011-10-12 DIAGNOSIS — J449 Chronic obstructive pulmonary disease, unspecified: Secondary | ICD-10-CM

## 2011-10-12 MED ORDER — NEBULIZER COMPRESSOR MISC: Status: DC

## 2011-10-12 MED ORDER — FORMOTEROL FUMARATE 20 MCG/2ML IN NEBU: 20.0000 ug | INHALATION_SOLUTION | Freq: Two times a day (BID) | RESPIRATORY_TRACT | Status: DC

## 2011-10-12 NOTE — Progress Notes (Signed)
Subjective:    Patient ID: [name], female    DOB: [birth year], 77 y.o.   MRN: [id number]  HPI    10/12/2011 Spiriva not working as well.  Pt notes is more winded.  No real cough.  No wheezing.  Edema in feet worse.  Hx of lymphedema.  Not wearing compression stockings.   Pt denies any significant sore throat, nasal congestion or excess secretions, fever, chills, sweats, unintended weight loss, pleurtic or exertional chest pain, orthopnea PND, or leg swelling Pt denies any increase in rescue therapy over baseline, denies waking up needing it or having any early am or nocturnal exacerbations of coughing/wheezing/or dyspnea. Pt also denies any obvious fluctuation in symptoms with  weather or environmental change or other alleviating or aggravating factors     Past Medical History  Diagnosis Date  . GERD (gastroesophageal reflux disease)   . Hypothyroidism   . Bipolar 1 disorder   . Obesity   . Anemia   . Pulmonary nodule   . Tremors of nervous system   . Memory loss   . Urinary incontinence      Family History  Problem Relation Age of Onset  . Stroke Mother      History   Social History  . Marital Status: Married    Spouse Name: N/A    Number of Children: 2  . Years of Education: N/A   Occupational History  . Retired [employer] operational officer    Social History Main Topics  . Smoking status: Never Smoker   . Smokeless tobacco: Never Used  . Alcohol Use: No  . Drug Use: No  . Sexually Active: Not on file   Other Topics Concern  . Not on file   Social History Narrative  . No narrative on file     Allergies  Allergen Reactions  . Amoxicillin     REACTION: unknown  . Nitrofurantoin     REACTION: severe reaction, throat swelling  . Nitrofurantoin Monohyd Macro   . Omeprazole-Sodium Bicarbonate     REACTION: unknown  . Sulfa Antibiotics   . Sulfasalazine     REACTION: unknown     Outpatient Prescriptions Prior to Visit  Medication Sig Dispense Refill  . aspirin  81 MG tablet Take 81 mg by mouth daily.        . Cholecalciferol (VITAMIN D) 2000 UNITS CAPS Take 2,000 capsules by mouth daily.        Marland Kitchen donepezil (ARICEPT) 23 MG TABS tablet Take 23 mg by mouth daily.        . ferrous sulfate 324 (65 FE) MG TBEC Take 1 tablet by mouth daily.       Marland Kitchen levothyroxine (SYNTHROID, LEVOTHROID) 75 MCG tablet Take 75 mcg by mouth daily.      Marland Kitchen lithium (ESKALITH) 450 MG CR tablet Once tablet three times week and 3/4 tablet the other 4 days      . memantine (NAMENDA) 10 MG tablet Take 10 mg by mouth daily.      . Multiple Vitamins-Minerals (CENTRUM SILVER PO) Take by mouth daily.      Marland Kitchen OLANZapine (ZYPREXA) 5 MG tablet Take 10 mg by mouth at bedtime.       . primidone (MYSOLINE) 50 MG tablet 2 tablets once daily      . tiotropium (SPIRIVA) 18 MCG inhalation capsule Place 18 mcg into inhaler and inhale daily.           Review of Systems  Constitutional:   No  weight loss, night sweats,  Fevers, chills, fatigue, lassitude. HEENT:   No headaches,  Difficulty swallowing,  Tooth/dental problems,  Sore throat,                No sneezing, itching, ear ache, nasal congestion, post nasal drip,   CV:  No chest pain,  Orthopnea, PND,+ +swelling in lower extremities, no anasarca, dizziness, palpitations  GI  No heartburn, indigestion, abdominal pain, nausea, vomiting, diarrhea, change in bowel habits, loss of appetite  Resp: Notes  shortness of breath with exertion not at rest.  No excess mucus, no productive cough,  No non-productive cough,  No coughing up of blood.  No change in color of mucus.  No wheezing.  No chest wall deformity  Skin: no rash or lesions.  GU: no dysuria, change in color of urine, no urgency or frequency.  No flank pain.  MS:  No joint pain or swelling.  No decreased range of motion.  No back pain.  Psych:  No change in mood or affect. No depression or anxiety.  No memory loss.     Objective:   Physical Exam  Filed Vitals:   10/12/11 1618    BP: 126/58  Pulse: 90  Temp: 97.8 F (36.6 C)  TempSrc: Oral  Height: 4\' 9"  (1.448 m)  Weight: 87.998 kg (194 lb)  SpO2: 93%    Gen: Pleasant, well-nourished, in no distress,  normal affect obese   ENT: No lesions,  mouth clear,  oropharynx clear, no postnasal drip  Neck: No JVD, no TMG, no carotid bruits  Lungs: No use of accessory muscles, no dullness to percussion, clear without rales or rhonchi  Cardiovascular: RRR, heart sounds normal, no murmur or gallops, 3+ peripheral edema, support hose in place  Abdomen: soft and NT, no HSM,  BS normal  Musculoskeletal: No deformities, no cyanosis or clubbing  Neuro: alert, non focal  Skin: Warm, no lesions or rashes        Assessment & Plan:   BRONCHITIS, OBSTRUCTIVE CHRONIC Copd with AB flare , failed Tudorza and Spiriva Plan Start perforomist bid by neb med    Updated Medication List Outpatient Encounter Prescriptions as of 10/12/2011  Medication Sig Dispense Refill  . aspirin 81 MG tablet Take 81 mg by mouth daily.        . Cholecalciferol (VITAMIN D) 2000 UNITS CAPS Take 2,000 capsules by mouth daily.        Marland Kitchen donepezil (ARICEPT) 23 MG TABS tablet Take 23 mg by mouth daily.        . ferrous sulfate 324 (65 FE) MG TBEC Take 1 tablet by mouth daily.       Marland Kitchen levothyroxine (SYNTHROID, LEVOTHROID) 75 MCG tablet Take 75 mcg by mouth daily.      Marland Kitchen lithium (ESKALITH) 450 MG CR tablet Once tablet three times week and 3/4 tablet the other 4 days      . memantine (NAMENDA) 10 MG tablet Take 10 mg by mouth daily.      . Multiple Vitamins-Minerals (CENTRUM SILVER PO) Take by mouth daily.      Marland Kitchen OLANZapine (ZYPREXA) 5 MG tablet Take 10 mg by mouth at bedtime.       . primidone (MYSOLINE) 50 MG tablet 2 tablets once daily      . DISCONTD: tiotropium (SPIRIVA) 18 MCG inhalation capsule Place 18 mcg into inhaler and inhale daily.      . formoterol (PERFOROMIST) 20 MCG/2ML nebulizer solution Take 2 mLs (20  mcg total) by  nebulization 2 (two) times daily.  120 mL  6  . Nebulizers (NEBULIZER COMPRESSOR) MISC Use with perforomist twice daily  1 each  0

## 2011-10-12 NOTE — Patient Instructions (Addendum)
We will obtain a nebulizer and use perforomist twice daily in nebulizer Stop Spiriva Return 3 months

## 2011-10-13 NOTE — Assessment & Plan Note (Signed)
Copd with AB flare , failed Tudorza and Spiriva Plan Start perforomist bid by neb med

## 2011-10-19 ENCOUNTER — Encounter: Payer: Self-pay | Admitting: Internal Medicine

## 2011-10-19 ENCOUNTER — Ambulatory Visit (INDEPENDENT_AMBULATORY_CARE_PROVIDER_SITE_OTHER): Payer: Medicare Other | Admitting: Internal Medicine

## 2011-10-19 VITALS — BP 126/72 | HR 83 | Temp 97.5°F | Resp 16 | Wt 194.0 lb

## 2011-10-19 DIAGNOSIS — H6120 Impacted cerumen, unspecified ear: Secondary | ICD-10-CM

## 2011-10-20 DIAGNOSIS — H6120 Impacted cerumen, unspecified ear: Secondary | ICD-10-CM | POA: Insufficient documentation

## 2011-10-20 NOTE — Assessment & Plan Note (Signed)
Bilateral ear canals successfully irrigated.

## 2011-10-20 NOTE — Progress Notes (Signed)
  Subjective:    Patient ID: [name], female    DOB: 05-Dec-1934, 77 y.o.   MRN: [id number]  HPI Pt presents to clinic for evaluation of cerumen impaction. States has appt with audiology who stated could not evaluate pt until ears were cleaned. Has diminished however no pain or tinnitus.   Past Medical History  Diagnosis Date  . GERD (gastroesophageal reflux disease)   . Hypothyroidism   . Bipolar 1 disorder   . Obesity   . Anemia   . Pulmonary nodule   . Tremors of nervous system   . Memory loss   . Urinary incontinence    Past Surgical History  Procedure Date  . D&c x2     reports that she has never smoked. She has never used smokeless tobacco. She reports that she does not drink alcohol or use illicit drugs. family history includes Stroke in her mother. Allergies  Allergen Reactions  . Amoxicillin     REACTION: unknown  . Nitrofurantoin     REACTION: severe reaction, throat swelling  . Nitrofurantoin Monohyd Macro   . Omeprazole-Sodium Bicarbonate     REACTION: unknown  . Sulfa Antibiotics   . Sulfasalazine     REACTION: unknown     Review of Systems see hpi     Objective:   Physical Exam  Nursing note and vitals reviewed. Constitutional: She appears well-developed and well-nourished. No distress.  HENT:  Head: Normocephalic and atraumatic.  Right Ear: External ear normal.  Left Ear: External ear normal.       Left ear canal partially obstructed with cerumen. Right canal obstructed with cerumen  Skin: She is not diaphoretic.          Assessment & Plan:

## 2011-10-22 ENCOUNTER — Telehealth: Payer: Self-pay | Admitting: Critical Care Medicine

## 2011-10-22 NOTE — Telephone Encounter (Signed)
Spoke with pharmacy-gave verbal order-pt aware.

## 2011-10-23 ENCOUNTER — Telehealth: Payer: Self-pay | Admitting: Critical Care Medicine

## 2011-10-23 NOTE — Telephone Encounter (Signed)
Spoke with patients insurance at [phone]; pt has Medicare Part B and needs to have Rx filed under this plan.Pt and pharmacy are both aware. Pt to take insurance card to pharmacy.

## 2011-10-23 NOTE — Telephone Encounter (Signed)
PA needed for this medication per [facility] on [address]-Pt is aware of this. I will work on this for patient quickly as possible as she will be out of Rx tomorrow.

## 2011-11-07 ENCOUNTER — Telehealth: Payer: Self-pay | Admitting: *Deleted

## 2011-11-07 NOTE — Telephone Encounter (Signed)
Received message from pt stating she had a bad day of pain yesterday due to her arthritis and wants to know why we haven't sent a refill on her Celebrex. Celebrex was previously removed her medication list due to "pt not taken in the last 30 days". Pt also mentioned something about anemia but I was not able to understand her message.  Attempted to reach pt and left message on her home # to return my call.

## 2011-11-08 MED ORDER — CELECOXIB 200 MG PO CAPS: 200.0000 mg | ORAL_CAPSULE | ORAL | Status: DC | PRN

## 2011-11-08 NOTE — Telephone Encounter (Signed)
Patient returned phone call. Best # [phone]

## 2011-11-08 NOTE — Telephone Encounter (Signed)
Ok to fill #[id number]

## 2011-11-08 NOTE — Telephone Encounter (Signed)
Spoke with pt. She states Dr Charlett Blake (ortho) used to prescribe celebrex for her arthritis. Pt reports that Dr Charlett Blake recently told her she should not continue celebrex due to her anemia. Pt states she is in a lot of pain from her arthritis and wants to know what she should do? Pt states she has had mild anemia all her life and doesn't understand why this is a problem now?  Please advise.

## 2011-11-08 NOTE — Telephone Encounter (Signed)
Refills sent to pharmacy and pt notified. 

## 2011-12-04 ENCOUNTER — Other Ambulatory Visit: Payer: Self-pay | Admitting: Internal Medicine

## 2011-12-04 NOTE — Telephone Encounter (Signed)
Done/SLS 

## 2011-12-20 LAB — CBC
MCV: 86.4 fL (ref 78.0–100.0)
Platelets: 279 10*3/uL (ref 150–400)
RDW: 13.9 % (ref 11.5–15.5)
WBC: 6.5 10*3/uL (ref 4.0–10.5)

## 2011-12-20 LAB — BASIC METABOLIC PANEL
Calcium: 9.8 mg/dL (ref 8.4–10.5)
Chloride: 106 mEq/L (ref 96–112)
Creat: 0.92 mg/dL (ref 0.50–1.10)

## 2011-12-20 LAB — HEPATIC FUNCTION PANEL
ALT: 11 U/L (ref 0–35)
Bilirubin, Direct: 0.1 mg/dL (ref 0.0–0.3)
Indirect Bilirubin: 0.2 mg/dL (ref 0.0–0.9)

## 2011-12-20 LAB — LIPID PANEL
HDL: 60 mg/dL (ref >39)
LDL Cholesterol: 161 mg/dL — ABNORMAL HIGH (ref 0–99)
Total CHOL/HDL Ratio: 4.3 Ratio
Triglycerides: 194 mg/dL — ABNORMAL HIGH (ref <150)
VLDL: 39 mg/dL (ref 0–40)

## 2011-12-20 NOTE — Addendum Note (Signed)
Addended by: Mervin Kung A on: 12/20/2011 10:17 AM   Modules accepted: Orders

## 2011-12-20 NOTE — Telephone Encounter (Signed)
Pt presented to the lab, future orders released. 

## 2011-12-24 ENCOUNTER — Ambulatory Visit (INDEPENDENT_AMBULATORY_CARE_PROVIDER_SITE_OTHER): Payer: Medicare Other | Admitting: Internal Medicine

## 2011-12-24 ENCOUNTER — Encounter: Payer: Self-pay | Admitting: Internal Medicine

## 2011-12-24 VITALS — BP 130/64 | HR 80 | Temp 98.2°F | Resp 14 | Wt 195.0 lb

## 2011-12-24 DIAGNOSIS — I890 Lymphedema, not elsewhere classified: Secondary | ICD-10-CM

## 2011-12-24 DIAGNOSIS — E785 Hyperlipidemia, unspecified: Secondary | ICD-10-CM

## 2011-12-24 DIAGNOSIS — R011 Cardiac murmur, unspecified: Secondary | ICD-10-CM

## 2011-12-24 MED ORDER — ATORVASTATIN CALCIUM 20 MG PO TABS: 20.0000 mg | ORAL_TABLET | Freq: Every day | ORAL | Status: DC

## 2011-12-24 NOTE — Assessment & Plan Note (Signed)
Refer back to lymphedema clinic. Previously improved significantly with treatment there

## 2011-12-24 NOTE — Patient Instructions (Signed)
We are in the process of scheduling your heart ultrasound and follow up with the lymphedema clinic. Please schedule fasting labs for one month after you begin taking lipitor for your cholesterol

## 2011-12-24 NOTE — Assessment & Plan Note (Signed)
Attempt Lipitor 20 mg a day. Obtain fasting lipid profile and liver function test one month after beginning medication.

## 2011-12-24 NOTE — Progress Notes (Signed)
  Subjective:    Patient ID: [name], female    DOB: 1934/03/22, 77 y.o.   MRN: [id number]  HPI Pt presents to clinic for followup of multiple medical problems. History of lymphedema and is worsened over the past several months. Previously well controlled while seeing the lymphedema clinic. States has intermittent falls with no recent injury and is now using a walker for safety. Reviewed elevated LDL. Not currently on statin therapy. Denies previous side effects to statin therapy  Past Medical History  Diagnosis Date  . GERD (gastroesophageal reflux disease)   . Hypothyroidism   . Bipolar 1 disorder   . Obesity   . Anemia   . Pulmonary nodule   . Tremors of nervous system   . Memory loss   . Urinary incontinence    Past Surgical History  Procedure Date  . D&c x2     reports that she has never smoked. She has never used smokeless tobacco. She reports that she does not drink alcohol or use illicit drugs. family history includes Stroke in her mother. Allergies  Allergen Reactions  . Amoxicillin     REACTION: unknown  . Nitrofurantoin     REACTION: severe reaction, throat swelling  . Nitrofurantoin Monohyd Macro   . Omeprazole-Sodium Bicarbonate     REACTION: unknown  . Sulfa Antibiotics   . Sulfasalazine     REACTION: unknown      Review of Systems see hpi     Objective:   Physical Exam  Nursing note and vitals reviewed. Constitutional: She appears well-developed and well-nourished. No distress.  HENT:  Head: Normocephalic and atraumatic.  Eyes: Conjunctivae normal are normal. No scleral icterus.  Neck: Neck supple. No JVD present.  Cardiovascular: Normal rate and regular rhythm.  Exam reveals no gallop and no friction rub.   Murmur heard. Pulmonary/Chest: Effort normal and breath sounds normal. No respiratory distress. She has no wheezes. She has no rales.  Neurological: She is alert.  Skin: Skin is warm and dry. She is not diaphoretic.       Significant  bilateral lower extremity swelling.  Psychiatric: She has a normal mood and affect.          Assessment & Plan:

## 2011-12-24 NOTE — Assessment & Plan Note (Signed)
Obtain echocardiogram

## 2011-12-27 ENCOUNTER — Ambulatory Visit (HOSPITAL_COMMUNITY): Payer: Medicare Other | Attending: Cardiovascular Disease | Admitting: Radiology

## 2011-12-27 ENCOUNTER — Other Ambulatory Visit: Payer: Self-pay

## 2011-12-27 DIAGNOSIS — E785 Hyperlipidemia, unspecified: Secondary | ICD-10-CM | POA: Insufficient documentation

## 2011-12-27 DIAGNOSIS — I890 Lymphedema, not elsewhere classified: Secondary | ICD-10-CM | POA: Insufficient documentation

## 2011-12-27 DIAGNOSIS — I059 Rheumatic mitral valve disease, unspecified: Secondary | ICD-10-CM | POA: Insufficient documentation

## 2011-12-27 DIAGNOSIS — R011 Cardiac murmur, unspecified: Secondary | ICD-10-CM | POA: Insufficient documentation

## 2011-12-27 DIAGNOSIS — I359 Nonrheumatic aortic valve disorder, unspecified: Secondary | ICD-10-CM | POA: Insufficient documentation

## 2012-01-01 ENCOUNTER — Telehealth: Payer: Self-pay | Admitting: *Deleted

## 2012-01-01 DIAGNOSIS — E785 Hyperlipidemia, unspecified: Secondary | ICD-10-CM

## 2012-01-01 NOTE — Telephone Encounter (Signed)
Message copied by Regis Bill on Tue Jan 01, 2012 10:10 AM ------      Message from: Edwyna Perfect      Created: Mon Dec 31, 2011  7:28 PM       Anemia stable. Thyroid under good control. Chol high. Recommend lipitor 20mg  qd with lipid/lft 272.4 4 wks later

## 2012-01-01 NOTE — Telephone Encounter (Signed)
Pt informed via husband of results; pt has received Lipitor & began taking at OV; future lab orders placed, understood & agreed/SLS

## 2012-01-08 ENCOUNTER — Telehealth: Payer: Self-pay | Admitting: *Deleted

## 2012-01-08 NOTE — Telephone Encounter (Signed)
Non statin would be zetia 10mg  po qd. Rf6. pls dc lipitor and replace

## 2012-01-08 NOTE — Telephone Encounter (Signed)
Patient states tat she D/C Lipitor and will not be coming in for f/u labs; reports that "she has memory issues" and was made aware that this Rx can "exasperate this problem & cause confusion", so she feels that this medication is not good for her. Patient states that she has no problem with non-statin [as far as she knows]/SLS Please advise.

## 2012-01-09 MED ORDER — EZETIMIBE 10 MG PO TABS: 10.0000 mg | ORAL_TABLET | Freq: Every day | ORAL | Status: DC

## 2012-01-09 NOTE — Telephone Encounter (Signed)
Rx to pharmacy; informed patient's spouse, understood & agreed/SLS

## 2012-01-28 ENCOUNTER — Ambulatory Visit: Payer: Medicare Other | Admitting: Critical Care Medicine

## 2012-01-30 ENCOUNTER — Encounter: Payer: Self-pay | Admitting: Critical Care Medicine

## 2012-01-30 ENCOUNTER — Ambulatory Visit (INDEPENDENT_AMBULATORY_CARE_PROVIDER_SITE_OTHER): Payer: Medicare Other | Admitting: Critical Care Medicine

## 2012-01-30 VITALS — BP 122/60 | HR 73 | Temp 97.7°F | Ht <= 58 in | Wt 193.6 lb

## 2012-01-30 DIAGNOSIS — J449 Chronic obstructive pulmonary disease, unspecified: Secondary | ICD-10-CM

## 2012-01-30 MED ORDER — FORMOTEROL FUMARATE 20 MCG/2ML IN NEBU: INHALATION_SOLUTION | RESPIRATORY_TRACT | Status: DC

## 2012-01-30 NOTE — Progress Notes (Signed)
Subjective:    Patient ID: [name], female    DOB: [birth year], 77 y.o.   MRN: [id number]  HPI    10/12/2011 Spiriva not working as well.  Pt notes is more winded.  No real cough.  No wheezing.  Edema in feet worse.  Hx of lymphedema.  Not wearing compression stockings.   Pt denies any significant sore throat, nasal congestion or excess secretions, fever, chills, sweats, unintended weight loss, pleurtic or exertional chest pain, orthopnea PND, or leg swelling Pt denies any increase in rescue therapy over baseline, denies waking up needing it or having any early am or nocturnal exacerbations of coughing/wheezing/or dyspnea. Pt also denies any obvious fluctuation in symptoms with  weather or environmental change or other alleviating or aggravating factors  01/30/2012 At last ov we rec: start perforomist and stop spiriva. Pt failed tudorza/spiriva No real benefit from perforomist. Pt notes sl cough.  Dyspnea mainly with exertion.  No dyspnea in mid of night.      Past Medical History  Diagnosis Date  . GERD (gastroesophageal reflux disease)   . Hypothyroidism   . Bipolar 1 disorder   . Obesity   . Anemia   . Pulmonary nodule   . Tremors of nervous system   . Memory loss   . Urinary incontinence      Family History  Problem Relation Age of Onset  . Stroke Mother      History   Social History  . Marital Status: Married    Spouse Name: N/A    Number of Children: 2  . Years of Education: N/A   Occupational History  . Retired [employer] operational officer    Social History Main Topics  . Smoking status: Never Smoker   . Smokeless tobacco: Never Used  . Alcohol Use: No  . Drug Use: No  . Sexually Active: Not on file   Other Topics Concern  . Not on file   Social History Narrative  . No narrative on file     Allergies  Allergen Reactions  . Amoxicillin     REACTION: unknown  . Nitrofurantoin     REACTION: severe reaction, throat swelling  . Nitrofurantoin Monohyd Macro    . Omeprazole-Sodium Bicarbonate     REACTION: unknown  . Sulfa Antibiotics   . Sulfasalazine     REACTION: unknown     Outpatient Prescriptions Prior to Visit  Medication Sig Dispense Refill  . aspirin 81 MG tablet Take 81 mg by mouth daily.        Marland Kitchen buPROPion (WELLBUTRIN SR) 150 MG 12 hr tablet Take 150 mg by mouth daily.       . celecoxib (CELEBREX) 200 MG capsule Take 1 capsule (200 mg total) by mouth as needed.  30 capsule  3  . Cholecalciferol (VITAMIN D) 2000 UNITS CAPS Take 2,000 capsules by mouth daily.        Marland Kitchen donepezil (ARICEPT) 23 MG TABS tablet Take 23 mg by mouth daily.        Marland Kitchen ezetimibe (ZETIA) 10 MG tablet Take 1 tablet (10 mg total) by mouth daily.  30 tablet  3  . ferrous sulfate 324 (65 FE) MG TBEC Take 1 tablet by mouth daily.       Marland Kitchen levothyroxine (SYNTHROID, LEVOTHROID) 75 MCG tablet take 1 tablet by mouth once daily  30 tablet  5  . lithium (ESKALITH) 450 MG CR tablet Once tablet three times week and 3/4 tablet the other 4 days      .  memantine (NAMENDA) 10 MG tablet Take 10 mg by mouth daily.      . Multiple Vitamins-Minerals (CENTRUM SILVER PO) Take by mouth daily.      . Nebulizers (NEBULIZER COMPRESSOR) MISC Use with perforomist twice daily  1 each  0  . OLANZapine (ZYPREXA) 5 MG tablet Take 10 mg by mouth at bedtime.       . primidone (MYSOLINE) 50 MG tablet 2 tablets once daily      . venlafaxine XR (EFFEXOR-XR) 75 MG 24 hr capsule Take 75 mg by mouth daily.       . formoterol (PERFOROMIST) 20 MCG/2ML nebulizer solution Take 2 mLs (20 mcg total) by nebulization 2 (two) times daily.  120 mL  6       Review of Systems  Constitutional:   No  weight loss, night sweats,  Fevers, chills, fatigue, lassitude. HEENT:   No headaches,  Difficulty swallowing,  Tooth/dental problems,  Sore throat,                No sneezing, itching, ear ache, nasal congestion, post nasal drip,   CV:  No chest pain,  Orthopnea, PND,+ +swelling in lower extremities, no anasarca,  dizziness, palpitations  GI  No heartburn, indigestion, abdominal pain, nausea, vomiting, diarrhea, change in bowel habits, loss of appetite  Resp: Notes  shortness of breath with exertion not at rest.  No excess mucus, no productive cough,  No non-productive cough,  No coughing up of blood.  No change in color of mucus.  No wheezing.  No chest wall deformity  Skin: no rash or lesions.  GU: no dysuria, change in color of urine, no urgency or frequency.  No flank pain.  MS:  No joint pain or swelling.  No decreased range of motion.  No back pain.  Psych:  No change in mood or affect. No depression or anxiety.  No memory loss.     Objective:   Physical Exam  Filed Vitals:   01/30/12 1524  BP: 122/60  Pulse: 73  Temp: 97.7 F (36.5 C)  TempSrc: Oral  Height: 4\' 9"  (1.448 m)  Weight: 193 lb 9.6 oz (87.816 kg)  SpO2: 90%    Gen: Pleasant, well-nourished, in no distress,  normal affect obese   ENT: No lesions,  mouth clear,  oropharynx clear, no postnasal drip  Neck: No JVD, no TMG, no carotid bruits  Lungs: No use of accessory muscles, no dullness to percussion, clear without rales or rhonchi  Cardiovascular: RRR, heart sounds normal, no murmur or gallops, 3+ peripheral edema, support hose in place  Abdomen: soft and NT, no HSM,  BS normal  Musculoskeletal: No deformities, no cyanosis or clubbing  Neuro: alert, non focal  Skin: Warm, no lesions or rashes        Assessment & Plan:   BRONCHITIS, OBSTRUCTIVE CHRONIC Chronic obstructive bronchitis with chronic hypoxic respiratory failure. Note overnight oximetry normal and no longer on supplemental oxygen. Note no real response to bronchodilator therapy either by [facility] device or nebulization   Plan Discontinue perforomist  in nebulizer Observe expectantly    Updated Medication List Outpatient Encounter Prescriptions as of 01/30/2012  Medication Sig Dispense Refill  . aspirin 81 MG tablet Take 81 mg by mouth  daily.        Marland Kitchen buPROPion (WELLBUTRIN SR) 150 MG 12 hr tablet Take 150 mg by mouth daily.       . celecoxib (CELEBREX) 200 MG capsule Take 1 capsule (200 mg  total) by mouth as needed.  30 capsule  3  . Cholecalciferol (VITAMIN D) 2000 UNITS CAPS Take 2,000 capsules by mouth daily.        Marland Kitchen donepezil (ARICEPT) 23 MG TABS tablet Take 23 mg by mouth daily.        Marland Kitchen ezetimibe (ZETIA) 10 MG tablet Take 1 tablet (10 mg total) by mouth daily.  30 tablet  3  . ferrous sulfate 324 (65 FE) MG TBEC Take 1 tablet by mouth daily.       . formoterol (PERFOROMIST) 20 MCG/2ML nebulizer solution HOLD      . levothyroxine (SYNTHROID, LEVOTHROID) 75 MCG tablet take 1 tablet by mouth once daily  30 tablet  5  . lithium (ESKALITH) 450 MG CR tablet Once tablet three times week and 3/4 tablet the other 4 days      . memantine (NAMENDA) 10 MG tablet Take 10 mg by mouth daily.      . Multiple Vitamins-Minerals (CENTRUM SILVER PO) Take by mouth daily.      . Nebulizers (NEBULIZER COMPRESSOR) MISC Use with perforomist twice daily  1 each  0  . OLANZapine (ZYPREXA) 5 MG tablet Take 10 mg by mouth at bedtime.       . primidone (MYSOLINE) 50 MG tablet 2 tablets once daily      . venlafaxine XR (EFFEXOR-XR) 75 MG 24 hr capsule Take 75 mg by mouth daily.       . [DISCONTINUED] formoterol (PERFOROMIST) 20 MCG/2ML nebulizer solution Take 2 mLs (20 mcg total) by nebulization 2 (two) times daily.  120 mL  6  . [DISCONTINUED] formoterol (PERFOROMIST) 20 MCG/2ML nebulizer solution Take by nebulization. 2 vials in nebulizer once daily

## 2012-01-30 NOTE — Patient Instructions (Addendum)
HOLD perforomist for now Call with a report on how you are doing off perforomist

## 2012-01-31 NOTE — Assessment & Plan Note (Signed)
Chronic obstructive bronchitis with chronic hypoxic respiratory failure. Note overnight oximetry normal and no longer on supplemental oxygen. Note no real response to bronchodilator therapy either by [facility] device or nebulization   Plan Discontinue perforomist  in nebulizer Observe expectantly

## 2012-02-13 ENCOUNTER — Telehealth: Payer: Self-pay | Admitting: Critical Care Medicine

## 2012-02-13 NOTE — Telephone Encounter (Signed)
LMTCB

## 2012-02-14 NOTE — Telephone Encounter (Signed)
Pt states her breathing is unchanged off Perforomist. She is still having DOE but no better or worse. PW, pls advise. Allergies  Allergen Reactions  . Amoxicillin     REACTION: unknown  . Nitrofurantoin     REACTION: severe reaction, throat swelling  . Nitrofurantoin Monohyd Macro   . Omeprazole-Sodium Bicarbonate     REACTION: unknown  . Sulfa Antibiotics   . Sulfasalazine     REACTION: unknown

## 2012-02-14 NOTE — Telephone Encounter (Signed)
Since the perforomist has neither helped her on or off the med, stay off perforomist for now  I do not have any other therapy to offer to change her breathing at this time  Pulmonary f/u was prn

## 2012-02-14 NOTE — Telephone Encounter (Signed)
Pt notified of Dr. Delford Field recs and verbalized understanding. She will call if she has any changes in her breathing or come in for OV if needed.

## 2012-03-06 ENCOUNTER — Other Ambulatory Visit: Payer: Self-pay | Admitting: Internal Medicine

## 2012-03-06 NOTE — Telephone Encounter (Signed)
Celebrex request [Last Rx 08.29.13 #30x3]/SLS Please advise.

## 2012-03-07 NOTE — Telephone Encounter (Signed)
#  30 rf 1 

## 2012-03-07 NOTE — Telephone Encounter (Signed)
Rx to pharmacy/SLS 

## 2012-03-10 ENCOUNTER — Ambulatory Visit (INDEPENDENT_AMBULATORY_CARE_PROVIDER_SITE_OTHER): Payer: Medicare Other | Admitting: Internal Medicine

## 2012-03-10 ENCOUNTER — Encounter: Payer: Self-pay | Admitting: Internal Medicine

## 2012-03-10 VITALS — BP 110/60 | HR 84 | Temp 97.4°F | Resp 16 | Wt 189.0 lb

## 2012-03-10 DIAGNOSIS — I890 Lymphedema, not elsewhere classified: Secondary | ICD-10-CM

## 2012-03-10 MED ORDER — DOXYCYCLINE HYCLATE 100 MG PO TABS: 100.0000 mg | ORAL_TABLET | Freq: Two times a day (BID) | ORAL | Status: AC

## 2012-03-10 NOTE — Assessment & Plan Note (Signed)
Contacted [facility] and pt has appt this week. Attempt course of abx given recent erythema.

## 2012-03-10 NOTE — Progress Notes (Signed)
  Subjective:    Patient ID: [name], female    DOB: [birth year], 77 y.o.   MRN: [id number]  HPI Pt presents to clinic for evaluation of leg swelling. Has h/o chronic bilateral lymphedema of legs previously responsive to tx at lymphedema clinic. Seen in October for same and referred back to clinic however pt states never received appt. Leg swelling has worsened. Legs intermittently drain typically clear fluid however there has been an area of focal redness on left lateral leg. No purulent drainage, fever or chills. No alleviating or exacerbating factors.   Past Medical History  Diagnosis Date  . GERD (gastroesophageal reflux disease)   . Hypothyroidism   . Bipolar 1 disorder   . Obesity   . Anemia   . Pulmonary nodule   . Tremors of nervous system   . Memory loss   . Urinary incontinence    Past Surgical History  Procedure Date  . D&c x2     reports that she has never smoked. She has never used smokeless tobacco. She reports that she does not drink alcohol or use illicit drugs. family history includes Stroke in her mother. Allergies  Allergen Reactions  . Amoxicillin     REACTION: unknown  . Nitrofurantoin     REACTION: severe reaction, throat swelling  . Nitrofurantoin Monohyd Macro   . Omeprazole-Sodium Bicarbonate     REACTION: unknown  . Sulfa Antibiotics   . Sulfasalazine     REACTION: unknown     Review of Systems see hpi     Objective:   Physical Exam  Nursing note and vitals reviewed. Constitutional: She appears well-developed and well-nourished.  HENT:  Head: Normocephalic and atraumatic.  Skin: Skin is warm and dry. There is erythema.       Increased bilateral le swelling. Left lateral leg with superficial erythema without purulent drainage or warmth.  Psychiatric: She has a normal mood and affect.          Assessment & Plan:

## 2012-03-11 ENCOUNTER — Telehealth: Payer: Self-pay | Admitting: *Deleted

## 2012-03-11 ENCOUNTER — Other Ambulatory Visit: Payer: Self-pay | Admitting: Internal Medicine

## 2012-03-11 DIAGNOSIS — S81802A Unspecified open wound, left lower leg, initial encounter: Secondary | ICD-10-CM

## 2012-03-11 NOTE — Telephone Encounter (Signed)
Caller reports that pt is in need of referral for Wound Care, bilateral lower extremities, prior to resuming therapy for lymphadema; her facility can set pt up w/wound care in [place] or we can arrange in [place]. Spoke with pt and it will be more convenient to have this taken care of in [place]; provider informed.

## 2012-03-24 ENCOUNTER — Ambulatory Visit: Payer: Medicare Other | Admitting: Internal Medicine

## 2012-03-27 ENCOUNTER — Encounter (HOSPITAL_BASED_OUTPATIENT_CLINIC_OR_DEPARTMENT_OTHER): Payer: Medicare Other | Attending: Internal Medicine

## 2012-03-27 DIAGNOSIS — X58XXXA Exposure to other specified factors, initial encounter: Secondary | ICD-10-CM | POA: Insufficient documentation

## 2012-03-27 DIAGNOSIS — S81009A Unspecified open wound, unspecified knee, initial encounter: Secondary | ICD-10-CM | POA: Insufficient documentation

## 2012-03-27 DIAGNOSIS — I890 Lymphedema, not elsewhere classified: Secondary | ICD-10-CM | POA: Insufficient documentation

## 2012-03-27 NOTE — Progress Notes (Signed)
[facility]  NAMEPHAEDRA, [name]                ACCOUNT NO.:  [id number]  MEDICAL RECORD NO.:  [id number]      DATE OF BIRTH:  02-01-1935  PHYSICIAN:  Maxwell Caul, M.D. VISIT DATE:  03/27/2012                                  OFFICE VISIT   HISTORY OF PRESENT ILLNESS:  [name] is a 78 year old woman who arrives for review of chronic wounds on the right greater than left lower extremity.  She apparently has been followed at a Lymphedema Clinic in [place] for 4 lymphedema.  Both her husband and the patient tell me that the lymphedema has only been present for the last 2- 3 years.  I am not certain what workup per clinical evaluation, she has had for this, the exact cause is unclear, although it is clearly not something that she had as a younger person.  In any case, things have markedly worsened over the last 1-1/2 months.  She was seen at her Lymphedema Clinic which again is I think part of [facility].  She developed open areas predominantly on the right lateral leg.  They have been washing the leg daily and applying gauze under an Ace wrap.  She was recently put on Cipro for 1 week b.i.d., and I have been asked to look at her today.  PAST MEDICAL HISTORY:  Includes gastroesophageal reflux disease, hypothyroidism, bipolar disorder, obesity, pulmonary nodule, tremors, mild aortic stenosis, chronic lymphedema, and hyperlipidemia.  MEDICATION LIST:  Reviewed.  PHYSICAL EXAMINATION:  Temperature is 98.3, pulse 63, respirations 18, blood pressure 141/71, respiratory routine dictation. Cardiac, there is a 2/6 systolic ejection murmur that does not radiate to the carotids. There was no gallops.  Her JVP is not elevated.  There was no signs of overt right ventricular heart failure.  In the lower extremities, there is marked lower extremity edema which is not pitting compatible with lymphedema.  Wound exam is on both legs.  On the right lateral  lower extremity measuring 2 x 2.3 x 6.9 x 0.1, and on the left posterolateral lower extremity measuring 0.3 x 0.3 x 0.1.  Both of these wounds have significant surrounding erythema, some warmth and mild tenderness.  Circulation as calculated in this clinic, her right ABI was 1.16, left 1.11.  Capillary refill time seemed to be normal bilaterally.  IMPRESSION: 1. Lower extremity wounds in the setting of quite severe secondary     lymphedema.  The exact etiology here is unclear. 2. Possible cellulitis of the lower extremities with surrounding     inflammation of both sites.  I have prescribed oral doxycycline at 100 b.i.d. for 10 days to cover possible etiologies as this cellulitis is not well covered by Cipro.  I did not culture the wounds themselves.  We applied silver alginate to these, covered by ABD pads and a Profore wrap.  We will see this again next week in review.          ______________________________ Maxwell Caul, M.D.     MGR/MEDQ  D:  03/27/2012  T:  03/27/2012  Job:  [id number]

## 2012-03-31 ENCOUNTER — Ambulatory Visit: Payer: Medicare Other | Admitting: Internal Medicine

## 2012-04-01 ENCOUNTER — Ambulatory Visit: Payer: Medicare Other | Admitting: Internal Medicine

## 2012-04-17 ENCOUNTER — Encounter (HOSPITAL_BASED_OUTPATIENT_CLINIC_OR_DEPARTMENT_OTHER): Payer: Medicare Other | Attending: Internal Medicine

## 2012-04-17 DIAGNOSIS — I890 Lymphedema, not elsewhere classified: Secondary | ICD-10-CM | POA: Insufficient documentation

## 2012-04-20 ENCOUNTER — Emergency Department (HOSPITAL_COMMUNITY): Payer: Medicare Other

## 2012-04-20 ENCOUNTER — Encounter (HOSPITAL_COMMUNITY): Payer: Self-pay | Admitting: Emergency Medicine

## 2012-04-20 ENCOUNTER — Emergency Department (HOSPITAL_COMMUNITY)
Admission: EM | Admit: 2012-04-20 | Discharge: 2012-04-20 | Disposition: A | Discharge status: Home / Self Care | Payer: Medicare Other | Attending: Emergency Medicine | Admitting: Emergency Medicine

## 2012-04-20 DIAGNOSIS — T17308A Unspecified foreign body in larynx causing other injury, initial encounter: Secondary | ICD-10-CM | POA: Insufficient documentation

## 2012-04-20 DIAGNOSIS — IMO0002 Reserved for concepts with insufficient information to code with codable children: Secondary | ICD-10-CM | POA: Insufficient documentation

## 2012-04-20 DIAGNOSIS — Y939 Activity, unspecified: Secondary | ICD-10-CM | POA: Insufficient documentation

## 2012-04-20 DIAGNOSIS — Y929 Unspecified place or not applicable: Secondary | ICD-10-CM | POA: Insufficient documentation

## 2012-04-20 DIAGNOSIS — Z79899 Other long term (current) drug therapy: Secondary | ICD-10-CM | POA: Insufficient documentation

## 2012-04-20 DIAGNOSIS — Z7982 Long term (current) use of aspirin: Secondary | ICD-10-CM | POA: Insufficient documentation

## 2012-04-20 DIAGNOSIS — T17320A Food in larynx causing asphyxiation, initial encounter: Secondary | ICD-10-CM

## 2012-04-20 HISTORY — DX: Unspecified foreign body in larynx causing other injury, initial encounter: T17.308A

## 2012-04-20 LAB — COMPREHENSIVE METABOLIC PANEL
AST: 28 U/L (ref 0–37)
BUN: 28 mg/dL — ABNORMAL HIGH (ref 6–23)
CO2: 26 mEq/L (ref 19–32)
Chloride: 101 mEq/L (ref 96–112)
Creatinine, Ser: 0.85 mg/dL (ref 0.50–1.10)
GFR calc Af Amer: 76 mL/min — ABNORMAL LOW (ref >90)
GFR calc non Af Amer: 65 mL/min — ABNORMAL LOW (ref >90)
Glucose, Bld: 133 mg/dL — ABNORMAL HIGH (ref 70–99)
Total Bilirubin: 0.2 mg/dL — ABNORMAL LOW (ref 0.3–1.2)

## 2012-04-20 LAB — CBC WITH DIFFERENTIAL/PLATELET
HCT: 36.9 % (ref 36.0–46.0)
Hemoglobin: 11.7 g/dL — ABNORMAL LOW (ref 12.0–15.0)
Lymphocytes Relative: 18 % (ref 12–46)
MCV: 93.7 fL (ref 78.0–100.0)
Monocytes Absolute: 1.2 10*3/uL — ABNORMAL HIGH (ref 0.1–1.0)
Monocytes Relative: 9 % (ref 3–12)
Neutro Abs: 9.5 10*3/uL — ABNORMAL HIGH (ref 1.7–7.7)
WBC: 13.7 10*3/uL — ABNORMAL HIGH (ref 4.0–10.5)

## 2012-04-20 NOTE — ED Provider Notes (Signed)
History     CSN: [id number]  Arrival date & time 04/20/12  4098   First MD Initiated Contact with Patient 04/20/12 1833      Chief Complaint  Patient presents with  . Airway Obstruction    (Consider location/radiation/quality/duration/timing/severity/associated sxs/prior treatment) Patient is a 78 y.o. female presenting with shortness of breath. The history is provided by the patient and the EMS personnel.  Shortness of Breath Severity:  Severe Onset quality:  Sudden Timing:  Constant Progression:  Unable to specify Chronicity:  New Context comment:  While eating Relieved by: Heimlich  Worsened by:  Nothing tried Ineffective treatments:  Position changes Associated symptoms: no abdominal pain, no chest pain, no diaphoresis, no fever, no headaches, no rash, no vomiting and no wheezing     Past Medical History  Diagnosis Date  . Choking     History reviewed. No pertinent past surgical history.  No family history on file.  History  Substance Use Topics  . Smoking status: Never Smoker   . Smokeless tobacco: Not on file  . Alcohol Use: No    OB History   Grav Para Term Preterm Abortions TAB SAB Ect Mult Living                  Review of Systems  Constitutional: Negative for fever, diaphoresis and fatigue.  HENT: Negative for congestion, rhinorrhea and postnasal drip.   Eyes: Negative for photophobia and visual disturbance.  Respiratory: Positive for choking and shortness of breath. Negative for chest tightness and wheezing.   Cardiovascular: Negative for chest pain, palpitations and leg swelling.  Gastrointestinal: Negative for nausea, vomiting, abdominal pain and diarrhea.  Genitourinary: Negative for urgency, frequency and difficulty urinating.  Musculoskeletal: Negative for back pain and arthralgias.  Skin: Negative for rash and wound.  Neurological: Negative for weakness and headaches.  Psychiatric/Behavioral: Negative for confusion and agitation.     Allergies  Sulfa antibiotics  Home Medications   Current Outpatient Rx  Name  Route  Sig  Dispense  Refill  . aspirin EC 81 MG tablet   Oral   Take 81 mg by mouth daily.         . memantine (NAMENDA) 10 MG tablet   Oral   Take 10 mg by mouth 2 (two) times daily.         . Multiple Vitamins-Minerals (CENTRUM SILVER ADULT 50+ PO)   Oral   Take 1 tablet by mouth daily.           BP 159/63  Pulse 98  Temp(Src) 97.6 F (36.4 C) (Oral)  Resp 21  SpO2 100%  Physical Exam  Nursing note and vitals reviewed. Constitutional: She is oriented to person, place, and time. She appears well-developed and well-nourished. She appears distressed.  HENT:  Head: Normocephalic and atraumatic.  Mouth/Throat: Oropharynx is clear and moist.  Eyes: EOM are normal. Pupils are equal, round, and reactive to light.  Neck: Normal range of motion. Neck supple.  Cardiovascular: Normal rate, regular rhythm, normal heart sounds and intact distal pulses.   Pulmonary/Chest: Effort normal. Stridor present. She has no wheezes. She has rales.  Abdominal: Soft. Bowel sounds are normal. She exhibits no distension. There is no tenderness. There is no rebound and no guarding.  Musculoskeletal: Normal range of motion. She exhibits no edema and no tenderness.  Lymphadenopathy:    She has no cervical adenopathy.  Neurological: She is alert and oriented to person, place, and time. She displays normal reflexes. No  cranial nerve deficit. She exhibits normal muscle tone. Coordination normal.  Skin: Skin is warm and dry. No rash noted.  Psychiatric: She has a normal mood and affect. Her behavior is normal.    ED Course  Procedures (including critical care time)  Labs Reviewed  CBC WITH DIFFERENTIAL - Abnormal; Notable for the following:    WBC 13.7 (*)    Hemoglobin 11.7 (*)    Neutro Abs 9.5 (*)    Monocytes Absolute 1.2 (*)    All other components within normal limits  COMPREHENSIVE METABOLIC PANEL  - Abnormal; Notable for the following:    Glucose, Bld 133 (*)    BUN 28 (*)    Total Bilirubin 0.2 (*)    GFR calc non Af Amer 65 (*)    GFR calc Af Amer 76 (*)    All other components within normal limits   Dg Chest 2 View  04/20/2012  *RADIOLOGY REPORT*  Clinical Data: Airway obstruction.  Choking episode on food.  CHEST - 2 VIEW  Comparison: None.  Findings: Low lung volumes are present, causing crowding of the pulmonary vasculature. Left lower lobe airspace opacity favors atelectasis.  The lungs appear otherwise clear.  Thoracic spondylosis noted.  Atherosclerotic calcification of the thoracic aorta is present.  No pleural effusion identified.  Heart size within normal limits.  IMPRESSION:  1.  Left basilar airspace opacity, favoring atelectasis although aspiration pneumonitis cannot be completely excluded - consider follow-up chest radiography, particularly if the patient has persistent symptoms. 2.  Thoracic spondylosis. 3.  Atherosclerosis.   Original Report Authenticated By: Gaylyn Rong, M.D.      1. Choking due to food (regurgitated)       MDM  Presents from [employer] after choking on a piece of steak. EMS performed Heimlich and attempted to suction out without success. 02 sats 92% on NRB. On arrival, pt alert but not comfortably talking. Appeared to be maintaining her airway. She then coughed up a large piece of steak. Pt now verbal and feeling better. She no longer has a foreign body sensation in her throat. Exam is now unremarkable. No abdominal tendernes or chest wall tenderness. Will obtain basic labs, CXR, and wean 02.  CXR with possible LLL opacity. Doubt aspiration or pneumonia. Pt feeling much better. She has been observed for about an hour and patient is ambulating. 02 has been weaned off and she is not hypoxic or dyspneic. Labs not concerning. Will d/c home. Pt going home with her husband who will watch for recurrent respiratory issues.       Johnnette Gourd,  MD 04/20/12 [phone]

## 2012-04-20 NOTE — ED Provider Notes (Signed)
Pt seen on arrival to room, here for partial airway obstruction.  She was able to successfully cough the piece of steak up.  She is now improved.  Will continue to follow with resident  Joya Gaskins, MD 04/20/12 [phone]

## 2012-04-20 NOTE — ED Notes (Signed)
Pt was eating steak and started choking, heimlich unsucessful pta, spo2 92% on nrb. On arrival to ed, pt was able to clear airway and coughed up large piece of steak.

## 2012-04-20 NOTE — ED Notes (Signed)
Patient transported to X-ray 

## 2012-04-21 ENCOUNTER — Encounter: Payer: Self-pay | Admitting: Internal Medicine

## 2012-04-21 NOTE — ED Provider Notes (Signed)
I have personally seen and examined the patient.  I have discussed the plan of care with the resident.  I have reviewed the documentation on PMH/FH/Soc. History.  I have reviewed the documentation of the resident and agree.   Joya Gaskins, MD 04/21/12 [phone]

## 2012-05-06 ENCOUNTER — Other Ambulatory Visit: Payer: Self-pay | Admitting: Internal Medicine

## 2012-06-05 ENCOUNTER — Ambulatory Visit: Payer: Medicare Other | Admitting: Family Medicine

## 2012-06-09 ENCOUNTER — Other Ambulatory Visit: Payer: Self-pay | Admitting: Internal Medicine

## 2012-06-09 NOTE — Telephone Encounter (Signed)
Rx request to pharmacy/SLS  

## 2012-06-10 ENCOUNTER — Ambulatory Visit: Payer: Medicare Other | Admitting: Family Medicine

## 2012-06-11 ENCOUNTER — Encounter (HOSPITAL_COMMUNITY): Payer: Self-pay | Admitting: *Deleted

## 2012-06-11 ENCOUNTER — Emergency Department (HOSPITAL_COMMUNITY): Payer: Medicare Other

## 2012-06-11 ENCOUNTER — Emergency Department (HOSPITAL_COMMUNITY)
Admission: EM | Admit: 2012-06-11 | Discharge: 2012-06-11 | Disposition: A | Discharge status: Home / Self Care | Payer: Medicare Other | Attending: Emergency Medicine | Admitting: Emergency Medicine

## 2012-06-11 DIAGNOSIS — W1809XA Striking against other object with subsequent fall, initial encounter: Secondary | ICD-10-CM | POA: Insufficient documentation

## 2012-06-11 DIAGNOSIS — E669 Obesity, unspecified: Secondary | ICD-10-CM | POA: Insufficient documentation

## 2012-06-11 DIAGNOSIS — Z87828 Personal history of other (healed) physical injury and trauma: Secondary | ICD-10-CM | POA: Insufficient documentation

## 2012-06-11 DIAGNOSIS — Y9289 Other specified places as the place of occurrence of the external cause: Secondary | ICD-10-CM | POA: Insufficient documentation

## 2012-06-11 DIAGNOSIS — W19XXXA Unspecified fall, initial encounter: Secondary | ICD-10-CM

## 2012-06-11 DIAGNOSIS — E039 Hypothyroidism, unspecified: Secondary | ICD-10-CM | POA: Insufficient documentation

## 2012-06-11 DIAGNOSIS — F319 Bipolar disorder, unspecified: Secondary | ICD-10-CM | POA: Insufficient documentation

## 2012-06-11 DIAGNOSIS — S0101XA Laceration without foreign body of scalp, initial encounter: Secondary | ICD-10-CM

## 2012-06-11 DIAGNOSIS — R42 Dizziness and giddiness: Secondary | ICD-10-CM

## 2012-06-11 DIAGNOSIS — S0100XA Unspecified open wound of scalp, initial encounter: Secondary | ICD-10-CM | POA: Insufficient documentation

## 2012-06-11 DIAGNOSIS — D649 Anemia, unspecified: Secondary | ICD-10-CM | POA: Insufficient documentation

## 2012-06-11 DIAGNOSIS — Z79899 Other long term (current) drug therapy: Secondary | ICD-10-CM | POA: Insufficient documentation

## 2012-06-11 DIAGNOSIS — Y9389 Activity, other specified: Secondary | ICD-10-CM | POA: Insufficient documentation

## 2012-06-11 DIAGNOSIS — Z7982 Long term (current) use of aspirin: Secondary | ICD-10-CM | POA: Insufficient documentation

## 2012-06-11 DIAGNOSIS — Z8719 Personal history of other diseases of the digestive system: Secondary | ICD-10-CM | POA: Insufficient documentation

## 2012-06-11 LAB — URINALYSIS, ROUTINE W REFLEX MICROSCOPIC
Leukocytes, UA: NEGATIVE
Nitrite: NEGATIVE
Protein, ur: NEGATIVE mg/dL
Specific Gravity, Urine: 1.016 (ref 1.005–1.030)
Urobilinogen, UA: 0.2 mg/dL (ref 0.0–1.0)

## 2012-06-11 LAB — CBC
MCH: 30.2 pg (ref 26.0–34.0)
MCHC: 32.7 g/dL (ref 30.0–36.0)
MCV: 92.2 fL (ref 78.0–100.0)
Platelets: 288 10*3/uL (ref 150–400)
RDW: 13.8 % (ref 11.5–15.5)

## 2012-06-11 LAB — POCT I-STAT, CHEM 8
Creatinine, Ser: 0.9 mg/dL (ref 0.50–1.10)
HCT: 35 % — ABNORMAL LOW (ref 36.0–46.0)
Hemoglobin: 11.9 g/dL — ABNORMAL LOW (ref 12.0–15.0)
Potassium: 3.8 mEq/L (ref 3.5–5.1)
Sodium: 140 mEq/L (ref 135–145)
TCO2: 27 mmol/L (ref 0–100)

## 2012-06-11 NOTE — ED Notes (Addendum)
Per ems pt is from home. Hx of dementia. Pt was walking in her home, not using her walker, pt reported she got very dizzy and that caused her to fall. When ems arrived she was laying flat on her back. Pt hit back of head on [place], ems found blood all over white [place], pt layed on floor for about an hour. Laceration to back of head, no active bleeding, hair extremely matted with blood.  Lower back pain, pt passed upper and lower extremity spinal cord assessment, so pt was not placed on board.  20 g L AC  Pt has lymphodema bil legs, pain to legs is chronic.

## 2012-06-11 NOTE — ED Notes (Signed)
Bed:WA11<BR> Expected date:<BR> Expected time:<BR> Means of arrival:<BR> Comments:<BR> EMS

## 2012-06-11 NOTE — ED Provider Notes (Signed)
History     CSN: [id number]  Arrival date & time 06/11/12  1449   First MD Initiated Contact with Patient 06/11/12 1536      Chief Complaint  Patient presents with  . Fall  . Dizziness    (Consider location/radiation/quality/duration/timing/severity/associated sxs/prior treatment) HPI Comments: [name] is a 78 y.o. female who was in her kitchen, when she felt dizzy, and fell. She apparently laid on the floor because of inability to get up. Her husband found her about an hour later. He states that she has been normal, for her mentally,since he found her. There were no other injuries. The patient cannot specify the type of dizziness she had, or any other recent illness. The patient is a poor historian.  The husband reports that she sees a neurologist in [place], [place], for "memory problems.". She uses a walker sometimes to help with ambulation. She has severe lymphedema of both legs, but does not use the compression device recommended by a wound care doctor.    Level V caveat- poor historian  Patient is a 78 y.o. female presenting with fall. The history is provided by the patient.  Fall    Past Medical History  Diagnosis Date  . GERD (gastroesophageal reflux disease)   . Hypothyroidism   . Bipolar 1 disorder   . Obesity   . Anemia   . Pulmonary nodule   . Tremors of nervous system   . Memory loss   . Urinary incontinence   . Choking     Past Surgical History  Procedure Laterality Date  . D&c x2      Family History  Problem Relation Age of Onset  . Stroke Mother     History  Substance Use Topics  . Smoking status: Never Smoker   . Smokeless tobacco: Not on file  . Alcohol Use: No    OB History   Grav Para Term Preterm Abortions TAB SAB Ect Mult Living                  Review of Systems  Unable to perform ROS   Allergies  Amoxicillin; Nitrofurantoin; Nitrofurantoin monohyd macro; Omeprazole-sodium bicarbonate; Sulfa antibiotics;  and Sulfasalazine  Home Medications   Current Outpatient Rx  Name  Route  Sig  Dispense  Refill  . aspirin EC 81 MG tablet   Oral   Take 81 mg by mouth daily.         Marland Kitchen buPROPion (WELLBUTRIN SR) 150 MG 12 hr tablet   Oral   Take 150 mg by mouth daily.          . celecoxib (CELEBREX) 200 MG capsule   Oral   Take 200 mg by mouth daily as needed for pain.         Marland Kitchen donepezil (ARICEPT) 10 MG tablet   Oral   Take 20 mg by mouth daily.         . ferrous sulfate 324 (65 FE) MG TBEC   Oral   Take 1 tablet by mouth daily.          Marland Kitchen levothyroxine (SYNTHROID, LEVOTHROID) 75 MCG tablet   Oral   Take 75 mcg by mouth daily before breakfast.         . lithium carbonate (ESKALITH) 450 MG CR tablet   Oral   Take 337.5-450 mg by mouth at bedtime. Takes 1 tab 3 nights weekly, takes 0.75 tab 4 nights weekly; no specific days.         Marland Kitchen  Memantine HCl ER (NAMENDA XR) 28 MG CP24   Oral   Take 1 capsule by mouth at bedtime.         . Multiple Vitamin (MULTIVITAMIN WITH MINERALS) TABS   Oral   Take 1 tablet by mouth daily.         . primidone (MYSOLINE) 50 MG tablet   Oral   Take 100 mg by mouth daily.          . traZODone (DESYREL) 50 MG tablet   Oral   Take 50 mg by mouth at bedtime.         Marland Kitchen venlafaxine XR (EFFEXOR-XR) 75 MG 24 hr capsule   Oral   Take 150 mg by mouth daily.            BP 154/62  Pulse 112  Temp(Src) 98.4 F (36.9 C) (Oral)  Resp 19  SpO2 100%  Physical Exam  Nursing note and vitals reviewed. Constitutional: She appears well-developed.  Elderly, obese, with poor hygiene  HENT:  Head: Normocephalic.  Large mid parietal contusion with adherent blood clot. No palpable deformity or crepitation of the skull. Mucous membranes are dry.  Eyes: Conjunctivae and EOM are normal. Pupils are equal, round, and reactive to light.  Neck: Normal range of motion and phonation normal. Neck supple.  Cardiovascular: Normal rate, regular rhythm  and intact distal pulses.   Pulmonary/Chest: Effort normal and breath sounds normal. She exhibits no tenderness.  Abdominal: Soft. She exhibits no distension. There is no tenderness. There is no guarding.  Musculoskeletal: Normal range of motion. She exhibits edema ( 4+ pitting edema billaatterally.. No areas of cellulitis, or drainage of the legs.).  Neurological: She is alert. She has normal strength. She exhibits normal muscle tone.  She is oriented only to person. She cannot currently recall the events of the day.  Skin: Skin is warm and dry.  Visible dirt adherent to the feet and neck bilaterally.  Psychiatric: Her behavior is normal.    ED Course  Procedures (including critical care time)    Date: 12/28/2011  Rate: 72  Rhythm: normal sinus rhythm  QRS Axis: normal  PR and QT Intervals: normal  ST/T Wave abnormalities: normal  PR and QRS Conduction Disutrbances:none  Narrative Interpretation:   Old EKG Reviewed: unchanged- 04/03/08   LACERATION REPAIR Performed by: Flint Melter Consent: Verbal consent obtained. Risks and benefits: risks, benefits and alternatives were discussed Patient identity confirmed: provided demographic data Time out performed prior to procedure Prepped and Draped in normal sterile fashion Wound explored Laceration Location: Mid- parietal scalp Laceration Length: 4.5 cm No Foreign Bodies seen or palpated Anesthesia: local infiltration  Amount of cleaning: standard Skin closure: Staples  Number of sutures or staples: 5  Technique: Staples  Patient tolerance: Patient tolerated the procedure well with no immediate complications.  Labs Reviewed  CBC - Abnormal; Notable for the following:    WBC 12.4 (*)    All other components within normal limits  POCT I-STAT, CHEM 8 - Abnormal; Notable for the following:    Glucose, Bld 101 (*)    Hemoglobin 11.9 (*)    HCT 35.0 (*)    All other components within normal limits  URINE CULTURE  GLUCOSE,  CAPILLARY  URINALYSIS, ROUTINE W REFLEX MICROSCOPIC   Nursing Notes Reviewed/ Care Coordinated, and agree without changes. Applicable Imaging Reviewed Interpretation of Laboratory Data incorporated into ED treatment   1. Fall, initial encounter   2. Laceration of scalp, initial encounter  3. Episode of dizziness       MDM  While, cause unclear, without serious injury or evident cause. He is moderately disabled. She is supposed to use a walker, but refuses to do it. She is with her husband, who is caregiver.Doubt metabolic instability, serious bacterial infection or impending vascular collapse; the patient is stable for discharge.    Plan: Home Medications- usual; Home Treatments- wound care; Recommended follow up- PCP 10 days and prn      Flint Melter, MD 06/11/12 2043

## 2012-06-12 LAB — URINE CULTURE

## 2012-06-23 ENCOUNTER — Ambulatory Visit (INDEPENDENT_AMBULATORY_CARE_PROVIDER_SITE_OTHER): Payer: Medicare Other | Admitting: Family

## 2012-06-23 ENCOUNTER — Encounter: Payer: Self-pay | Admitting: Family

## 2012-06-23 VITALS — BP 142/76 | HR 67 | Temp 97.8°F | Resp 16 | Wt 167.0 lb

## 2012-06-23 DIAGNOSIS — S0191XA Laceration without foreign body of unspecified part of head, initial encounter: Secondary | ICD-10-CM

## 2012-06-23 DIAGNOSIS — S0190XA Unspecified open wound of unspecified part of head, initial encounter: Secondary | ICD-10-CM

## 2012-06-23 DIAGNOSIS — D72829 Elevated white blood cell count, unspecified: Secondary | ICD-10-CM

## 2012-06-23 NOTE — Progress Notes (Signed)
Subjective:    Patient ID: [name], female    DOB: [birth year], 78 y.o.   MRN: [id number]  HPI  [name] is a 78 yr old female who presents today for ED follow up. She was evaluated in the ED on 4/2 following a fall in her kitchen.  She suffered a scalp laceration and received staples for wound closure in the ED.  Records are reviewed. She was noted to have a mild leukocytosis at that time with a WBC of 12.4.  UA was unremarkable. The husband reports that the patient had an additional fall the night she returned home.  This was witnessed and there was not LOC. She was not using her walker.       Review of Systems  Constitutional: Negative for fever.  Respiratory: Negative for shortness of breath.   Cardiovascular: Negative for chest pain.   See HPI  Past Medical History  Diagnosis Date  . GERD (gastroesophageal reflux disease)   . Hypothyroidism   . Bipolar 1 disorder   . Obesity   . Anemia   . Pulmonary nodule   . Tremors of nervous system   . Memory loss   . Urinary incontinence   . Choking     History   Social History  . Marital Status: Married    Spouse Name: N/A    Number of Children: 2  . Years of Education: N/A   Occupational History  . Retired [employer] operational officer    Social History Main Topics  . Smoking status: Never Smoker   . Smokeless tobacco: Not on file  . Alcohol Use: No  . Drug Use: No  . Sexually Active: Not on file   Other Topics Concern  . Not on file   Social History Narrative   ** Merged History Encounter **        Past Surgical History  Procedure Laterality Date  . D&c x2      Family History  Problem Relation Age of Onset  . Stroke Mother     Allergies  Allergen Reactions  . Amoxicillin     REACTION: unknown  . Nitrofurantoin     REACTION: severe reaction, throat swelling  . Nitrofurantoin Monohyd Macro   . Omeprazole-Sodium Bicarbonate     REACTION: unknown  . Sulfa Antibiotics   . Sulfasalazine     REACTION: unknown     Current Outpatient Prescriptions on File Prior to Visit  Medication Sig Dispense Refill  . aspirin EC 81 MG tablet Take 81 mg by mouth daily.      Marland Kitchen buPROPion (WELLBUTRIN SR) 150 MG 12 hr tablet Take 150 mg by mouth daily.       . celecoxib (CELEBREX) 200 MG capsule Take 200 mg by mouth daily as needed for pain.      Marland Kitchen donepezil (ARICEPT) 10 MG tablet Take 20 mg by mouth daily.      . ferrous sulfate 324 (65 FE) MG TBEC Take 1 tablet by mouth daily.       Marland Kitchen levothyroxine (SYNTHROID, LEVOTHROID) 75 MCG tablet Take 75 mcg by mouth daily before breakfast.      . lithium carbonate (ESKALITH) 450 MG CR tablet Take 337.5-450 mg by mouth at bedtime. Takes 1 tab 3 nights weekly, takes 0.75 tab 4 nights weekly; no specific days.      . Memantine HCl ER (NAMENDA XR) 28 MG CP24 Take 1 capsule by mouth at bedtime.      . Multiple Vitamin (MULTIVITAMIN  WITH MINERALS) TABS Take 1 tablet by mouth daily.      . primidone (MYSOLINE) 50 MG tablet Take 100 mg by mouth daily.       . traZODone (DESYREL) 50 MG tablet Take 50 mg by mouth at bedtime.      Marland Kitchen venlafaxine XR (EFFEXOR-XR) 75 MG 24 hr capsule Take 150 mg by mouth daily.        No current facility-administered medications on file prior to visit.    BP 142/76  Pulse 67  Temp(Src) 97.8 F (36.6 C) (Oral)  Resp 16  Wt 167 lb (75.751 kg)  BMI 36.13 kg/m2  SpO2 99%       Objective:   Physical Exam  Constitutional:  Obese white female, poorly kempt, NAD.  HENT:  Head: Normocephalic.  Scalp laceration scabbed and healing well. 5 staples noted.  Cardiovascular: Normal rate and regular rhythm.   No murmur heard. Pulmonary/Chest: Effort normal and breath sounds normal. No respiratory distress. She has no wheezes. She has no rales. She exhibits no tenderness.  Lymphadenopathy:  bilat lower extrem  chronic lymphedema  Psychiatric: Cognition and memory are impaired.          Assessment & Plan:

## 2012-06-23 NOTE — Patient Instructions (Addendum)
Please complete lab work prior to leaving.  Please keep your upcoming appointment with Dr. Abner Greenspan.

## 2012-06-25 DIAGNOSIS — S0191XA Laceration without foreign body of unspecified part of head, initial encounter: Secondary | ICD-10-CM | POA: Insufficient documentation

## 2012-06-25 NOTE — Assessment & Plan Note (Addendum)
Healing well. 5 staples removed. Pt tolerated removal well.she has a scheduled follow up visit. We discussed ordering follow up cbc today, but they wish to defer to next visit.

## 2012-07-07 ENCOUNTER — Encounter: Payer: Self-pay | Admitting: Family Medicine

## 2012-07-07 ENCOUNTER — Ambulatory Visit (INDEPENDENT_AMBULATORY_CARE_PROVIDER_SITE_OTHER): Payer: Medicare Other | Admitting: Family Medicine

## 2012-07-07 VITALS — BP 130/66 | HR 80 | Temp 98.1°F | Resp 18 | Wt 168.2 lb

## 2012-07-07 DIAGNOSIS — I87309 Chronic venous hypertension (idiopathic) without complications of unspecified lower extremity: Secondary | ICD-10-CM

## 2012-07-07 DIAGNOSIS — I87303 Chronic venous hypertension (idiopathic) without complications of bilateral lower extremity: Secondary | ICD-10-CM

## 2012-07-07 DIAGNOSIS — L0390 Cellulitis, unspecified: Secondary | ICD-10-CM

## 2012-07-07 DIAGNOSIS — L0291 Cutaneous abscess, unspecified: Secondary | ICD-10-CM

## 2012-07-07 DIAGNOSIS — M549 Dorsalgia, unspecified: Secondary | ICD-10-CM

## 2012-07-07 DIAGNOSIS — R609 Edema, unspecified: Secondary | ICD-10-CM

## 2012-07-07 HISTORY — DX: Cellulitis, unspecified: L03.90

## 2012-07-07 MED ORDER — CELECOXIB 200 MG PO CAPS: 200.0000 mg | ORAL_CAPSULE | Freq: Two times a day (BID) | ORAL | Status: DC | PRN

## 2012-07-07 MED ORDER — CEFDINIR 300 MG PO CAPS: 300.0000 mg | ORAL_CAPSULE | Freq: Two times a day (BID) | ORAL | Status: AC

## 2012-07-07 NOTE — Assessment & Plan Note (Deleted)
Well controlled, no changes 

## 2012-07-07 NOTE — Assessment & Plan Note (Signed)
Persistent not worsening

## 2012-07-07 NOTE — Progress Notes (Signed)
Patient ID: [name], female   DOB: 10/09/1934, 78 y.o.   MRN: [id number] INDIGO [facility] [id number] April 05, 1934 07/07/2012      Progress Note-Follow Up  Subjective  Chief Complaint  Chief Complaint  Patient presents with  . Abdominal Pain    HPI  Patient is a 78 year old Caucasian female who is in today for evaluation of a rash on her right lower extremity. She puts her feet daily into the device for her chronic significant pedal edema. Never before she had a problem with a rash that's become painful and red and blistered and she presents today. She denies malaise or myalgias. She denies fevers or anorexia or nausea. No chest pain or palpitations no other acute complaints.  Past Medical History  Diagnosis Date  . GERD (gastroesophageal reflux disease)   . Hypothyroidism   . Bipolar 1 disorder   . Obesity   . Anemia   . Pulmonary nodule   . Tremors of nervous system   . Memory loss   . Urinary incontinence   . Choking   . Cellulitis 07/07/2012    Past Surgical History  Procedure Laterality Date  . D&c x2      Family History  Problem Relation Age of Onset  . Stroke Mother     History   Social History  . Marital Status: Married    Spouse Name: N/A    Number of Children: 2  . Years of Education: N/A   Occupational History  . Retired [employer] Services operational officer    Social History Main Topics  . Smoking status: Never Smoker   . Smokeless tobacco: Not on file  . Alcohol Use: No  . Drug Use: No  . Sexually Active: Not on file   Other Topics Concern  . Not on file   Social History Narrative   ** Merged History Encounter **        Current Outpatient Prescriptions on File Prior to Visit  Medication Sig Dispense Refill  . aspirin EC 81 MG tablet Take 81 mg by mouth daily.      Marland Kitchen buPROPion (WELLBUTRIN SR) 150 MG 12 hr tablet Take 150 mg by mouth daily.       Marland Kitchen donepezil (ARICEPT) 10 MG tablet Take 20 mg by mouth daily.      . ferrous sulfate 324 (65 FE) MG TBEC Take 1  tablet by mouth daily.       Marland Kitchen levothyroxine (SYNTHROID, LEVOTHROID) 75 MCG tablet Take 75 mcg by mouth daily before breakfast.      . lithium carbonate (ESKALITH) 450 MG CR tablet Take 337.5-450 mg by mouth at bedtime. Takes 1 tab 3 nights weekly, takes 0.75 tab 4 nights weekly; no specific days.      . Memantine HCl ER (NAMENDA XR) 28 MG CP24 Take 1 capsule by mouth at bedtime.      . Multiple Vitamin (MULTIVITAMIN WITH MINERALS) TABS Take 1 tablet by mouth daily.      . primidone (MYSOLINE) 50 MG tablet Take 100 mg by mouth daily.       . traZODone (DESYREL) 50 MG tablet Take 50 mg by mouth at bedtime.      Marland Kitchen venlafaxine XR (EFFEXOR-XR) 75 MG 24 hr capsule Take 150 mg by mouth daily.        No current facility-administered medications on file prior to visit.    Allergies  Allergen Reactions  . Amoxicillin     REACTION: unknown  . Nitrofurantoin  REACTION: severe reaction, throat swelling  . Nitrofurantoin Monohyd Macro   . Omeprazole-Sodium Bicarbonate     REACTION: unknown  . Sulfa Antibiotics   . Sulfasalazine     REACTION: unknown    Review of Systems  Review of Systems  Constitutional: Negative for fever and malaise/fatigue.  HENT: Negative for congestion.   Eyes: Negative for discharge.  Respiratory: Negative for shortness of breath.   Cardiovascular: Positive for leg swelling. Negative for chest pain and palpitations.  Gastrointestinal: Negative for nausea, abdominal pain and diarrhea.  Genitourinary: Negative for dysuria.  Musculoskeletal: Positive for myalgias. Negative for falls.  Skin: Positive for rash.  Neurological: Negative for loss of consciousness and headaches.  Endo/Heme/Allergies: Negative for polydipsia.  Psychiatric/Behavioral: Negative for depression and suicidal ideas. The patient is not nervous/anxious and does not have insomnia.     Objective  BP 130/66  Pulse 80  Temp(Src) 98.1 F (36.7 C) (Oral)  Resp 18  Wt 168 lb 4 oz (76.318 kg)   BMI 36.4 kg/m2  SpO2 91%  Physical Exam  Physical Exam  Constitutional: She is well-developed, well-nourished, and in no distress. No distress.  HENT:  Left Ear: External ear normal.  Mouth/Throat: No oropharyngeal exudate.  Eyes: EOM are normal. Left eye exhibits no discharge. No scleral icterus.  Neck: No JVD present. No tracheal deviation present.  Cardiovascular: Normal heart sounds and intact distal pulses.   Pulmonary/Chest: No respiratory distress. She has no rales.  Abdominal: She exhibits no distension and no mass. There is tenderness. There is no guarding.  Musculoskeletal: She exhibits edema and tenderness.  Erythema, tenderness right anterior tibial plateau, blister noted as well  Lymphadenopathy:    She has no cervical adenopathy.  Skin: No rash noted. No erythema.    Lab Results  Component Value Date   TSH 2.698 12/20/2011   Lab Results  Component Value Date   WBC 12.4* 06/11/2012   HGB 11.9* 06/11/2012   HCT 35.0* 06/11/2012   MCV 92.2 06/11/2012   PLT 288 06/11/2012   Lab Results  Component Value Date   CREATININE 0.90 06/11/2012   BUN 17 06/11/2012   NA 140 06/11/2012   K 3.8 06/11/2012   CL 105 06/11/2012   CO2 26 04/20/2012   Lab Results  Component Value Date   ALT 13 04/20/2012   AST 28 04/20/2012   ALKPHOS 82 04/20/2012   BILITOT 0.2* 04/20/2012   Lab Results  Component Value Date   CHOL 260* 12/20/2011   Lab Results  Component Value Date   HDL 60 12/20/2011   Lab Results  Component Value Date   LDLCALC 161* 12/20/2011   Lab Results  Component Value Date   TRIG 194* 12/20/2011   Lab Results  Component Value Date   CHOLHDL 4.3 12/20/2011     Assessment & Plan  LEG EDEMA Persistent not worsening  Cellulitis New blister, erythema and tenderness anterior tibial plateau on right. Given rx from Cefdinir 300 mg po bid x 7 days, probiotics daily and clean out the pump she uses to manage her edema

## 2012-07-07 NOTE — Assessment & Plan Note (Signed)
New blister, erythema and tenderness anterior tibial plateau on right. Given rx from Cefdinir 300 mg po bid x 7 days, probiotics daily and clean out the pump she uses to manage her edema

## 2012-07-07 NOTE — Patient Instructions (Addendum)
   Please clean the leg every day with mild soap and water Please clean the machine you put your leg in every day with rubbing alcohol  Start a probiotic such as Digestive Advantage, Align or a generic  Cellulitis Cellulitis is an infection of the skin and the tissue beneath it. The infected area is usually red and tender. Cellulitis occurs most often in the arms and lower legs.  CAUSES  Cellulitis is caused by bacteria that enter the skin through cracks or cuts in the skin. The most common types of bacteria that cause cellulitis are Staphylococcus and Streptococcus. SYMPTOMS   Redness and warmth.  Swelling.  Tenderness or pain.  Fever. DIAGNOSIS  Your caregiver can usually determine what is wrong based on a physical exam. Blood tests may also be done. TREATMENT  Treatment usually involves taking an antibiotic medicine. HOME CARE INSTRUCTIONS   Take your antibiotics as directed. Finish them even if you start to feel better.  Keep the infected arm or leg elevated to reduce swelling.  Apply a warm cloth to the affected area up to 4 times per day to relieve pain.  Only take over-the-counter or prescription medicines for pain, discomfort, or fever as directed by your caregiver.  Keep all follow-up appointments as directed by your caregiver. SEEK MEDICAL CARE IF:   You notice red streaks coming from the infected area.  Your red area gets larger or turns dark in color.  Your bone or joint underneath the infected area becomes painful after the skin has healed.  Your infection returns in the same area or another area.  You notice a swollen bump in the infected area.  You develop new symptoms. SEEK IMMEDIATE MEDICAL CARE IF:   You have a fever.  You feel very sleepy.  You develop vomiting or diarrhea.  You have a general ill feeling (malaise) with muscle aches and pains. MAKE SURE YOU:   Understand these instructions.  Will watch your condition.  Will get help  right away if you are not doing well or get worse. Document Released: 12/06/2004 Document Revised: 08/28/2011 Document Reviewed: 05/14/2011 [facility] Patient Information 2013 [place], [place].

## 2012-07-22 ENCOUNTER — Telehealth: Payer: Self-pay

## 2012-07-22 NOTE — Telephone Encounter (Signed)
Patients husband called stating that [facility] didn't receive pts last ov summary.  I refaxed to Attn: Dana at [phone]

## 2012-07-29 ENCOUNTER — Encounter: Payer: Self-pay | Admitting: Family Medicine

## 2012-07-29 ENCOUNTER — Telehealth: Payer: Self-pay

## 2012-07-29 ENCOUNTER — Ambulatory Visit (INDEPENDENT_AMBULATORY_CARE_PROVIDER_SITE_OTHER): Payer: Medicare Other | Admitting: Family Medicine

## 2012-07-29 VITALS — BP 118/72 | HR 98 | Temp 98.2°F | Ht <= 58 in | Wt 166.0 lb

## 2012-07-29 DIAGNOSIS — R1013 Epigastric pain: Secondary | ICD-10-CM

## 2012-07-29 DIAGNOSIS — E039 Hypothyroidism, unspecified: Secondary | ICD-10-CM

## 2012-07-29 DIAGNOSIS — L0390 Cellulitis, unspecified: Secondary | ICD-10-CM

## 2012-07-29 DIAGNOSIS — R609 Edema, unspecified: Secondary | ICD-10-CM

## 2012-07-29 MED ORDER — SACCHAROMYCES BOULARDII 250 MG PO CAPS: 250.0000 mg | ORAL_CAPSULE | Freq: Two times a day (BID) | ORAL | Status: DC

## 2012-07-29 NOTE — Patient Instructions (Signed)
Edema Edema is an abnormal build-up of fluids in tissues. Because this is partly dependent on gravity (water flows to the lowest place), it is more common in the legs and thighs (lower extremities). It is also common in the looser tissues, like around the eyes. Painless swelling of the feet and ankles is common and increases as a person ages. It may affect both legs and may include the calves or even thighs. When squeezed, the fluid may move out of the affected area and may leave a dent for a few moments. CAUSES   Prolonged standing or sitting in one place for extended periods of time. Movement helps pump tissue fluid into the veins, and absence of movement prevents this, resulting in edema.  Varicose veins. The valves in the veins do not work as well as they should. This causes fluid to leak into the tissues.  Fluid and salt overload.  Injury, burn, or surgery to the leg, ankle, or foot, may damage veins and allow fluid to leak out.  Sunburn damages vessels. Leaky vessels allow fluid to go out into the sunburned tissues.  Allergies (from insect bites or stings, medications or chemicals) cause swelling by allowing vessels to become leaky.  Protein in the blood helps keep fluid in your vessels. Low protein, as in malnutrition, allows fluid to leak out.  Hormonal changes, including pregnancy and menstruation, cause fluid retention. This fluid may leak out of vessels and cause edema.  Medications that cause fluid retention. Examples are sex hormones, blood pressure medications, steroid treatment, or anti-depressants.  Some illnesses cause edema, especially heart failure, kidney disease, or liver disease.  Surgery that cuts veins or lymph nodes, such as surgery done for the heart or for breast cancer, may result in edema. DIAGNOSIS  Your caregiver is usually easily able to determine what is causing your swelling (edema) by simply asking what is wrong (getting a history) and examining you (doing  a physical). Sometimes x-rays, EKG (electrocardiogram or heart tracing), and blood work may be done to evaluate for underlying medical illness. TREATMENT  General treatment includes:  Leg elevation (or elevation of the affected body part).  Restriction of fluid intake.  Prevention of fluid overload.  Compression of the affected body part. Compression with elastic bandages or support stockings squeezes the tissues, preventing fluid from entering and forcing it back into the blood vessels.  Diuretics (also called water pills or fluid pills) pull fluid out of your body in the form of increased urination. These are effective in reducing the swelling, but can have side effects and must be used only under your caregiver's supervision. Diuretics are appropriate only for some types of edema. The specific treatment can be directed at any underlying causes discovered. Heart, liver, or kidney disease should be treated appropriately. HOME CARE INSTRUCTIONS   Elevate the legs (or affected body part) above the level of the heart, while lying down.  Avoid sitting or standing still for prolonged periods of time.  Avoid putting anything directly under the knees when lying down, and do not wear constricting clothing or garters on the upper legs.  Exercising the legs causes the fluid to work back into the veins and lymphatic channels. This may help the swelling go down.  The pressure applied by elastic bandages or support stockings can help reduce ankle swelling.  A low-salt diet may help reduce fluid retention and decrease the ankle swelling.  Take any medications exactly as prescribed. SEEK MEDICAL CARE IF:  Your edema is   not responding to recommended treatments. SEEK IMMEDIATE MEDICAL CARE IF:   You develop shortness of breath or chest pain.  You cannot breathe when you lay down; or if, while lying down, you have to get up and go to the window to get your breath.  You are having increasing  swelling without relief from treatment.  You develop a fever over 102 F (38.9 C).  You develop pain or redness in the areas that are swollen.  Tell your caregiver right away if you have gained 3 lb/1.4 kg in 1 day or 5 lb/2.3 kg in a week. MAKE SURE YOU:   Understand these instructions.  Will watch your condition.  Will get help right away if you are not doing well or get worse. Document Released: 02/26/2005 Document Revised: 08/28/2011 Document Reviewed: 10/15/2007 [employer] Patient Information 2013 [employer].  

## 2012-07-29 NOTE — Telephone Encounter (Signed)
I spoke to [name] at [facility] at [phone] and she stated she wasn't in front of a computer but she would pass the message to have nurse look at pts R leg edema and R leg wound

## 2012-07-30 NOTE — Telephone Encounter (Signed)
[name] with [facility] called stating that pt has denied 6 appts since May 2 .[name] will try to call them again today

## 2012-08-01 NOTE — Assessment & Plan Note (Signed)
Signs of infection resolved

## 2012-08-01 NOTE — Progress Notes (Signed)
Patient ID: [name], female   DOB: 1934/07/30, 78 y.o.   MRN: [id number] LUNELL ROBART [id number] 11-23-1934 08/01/2012      Progress Note-Follow Up  Subjective  Chief Complaint  Chief Complaint  Patient presents with  . Follow-up    3 week    HPI  Patient is a 78 year old female who is here today with her husband. She continues to struggle with significant chronic peripheral edema in both extremities. At her last visit she had some skin breakdown and signs of early infection over her right anterior tibial plateau. She was placed on antibiotic and the redness and discomfort have improved. Unfortunately she continues to have swelling and open skin over the anterior tibial plateau. Denies any fevers or chills. Denies any increased malaise or myalgias. No increased shortness or breath, chest pain, palpitations GI or GU concerns noted today it was taking her medications as prescribed but has not been using her compression device to help with her lymphedema.  Past Medical History  Diagnosis Date  . GERD (gastroesophageal reflux disease)   . Hypothyroidism   . Bipolar 1 disorder   . Obesity   . Anemia   . Pulmonary nodule   . Tremors of nervous system   . Memory loss   . Urinary incontinence   . Choking   . Cellulitis 07/07/2012    Past Surgical History  Procedure Laterality Date  . D&c x2      Family History  Problem Relation Age of Onset  . Stroke Mother     History   Social History  . Marital Status: Married    Spouse Name: N/A    Number of Children: 2  . Years of Education: N/A   Occupational History  . Retired Diplomatic Services operational officer    Social History Main Topics  . Smoking status: Never Smoker   . Smokeless tobacco: Not on file  . Alcohol Use: No  . Drug Use: No  . Sexually Active: Not on file   Other Topics Concern  . Not on file   Social History Narrative   ** Merged History Encounter **        Current Outpatient Prescriptions on File Prior to Visit   Medication Sig Dispense Refill  . aspirin EC 81 MG tablet Take 81 mg by mouth daily.      Marland Kitchen buPROPion (WELLBUTRIN SR) 150 MG 12 hr tablet Take 150 mg by mouth daily.       . celecoxib (CELEBREX) 200 MG capsule Take 1 capsule (200 mg total) by mouth 2 (two) times daily as needed for pain.  60 capsule  3  . donepezil (ARICEPT) 10 MG tablet Take 20 mg by mouth daily.      . ferrous sulfate 324 (65 FE) MG TBEC Take 1 tablet by mouth daily.       Marland Kitchen levothyroxine (SYNTHROID, LEVOTHROID) 75 MCG tablet Take 75 mcg by mouth daily before breakfast.      . lithium carbonate (ESKALITH) 450 MG CR tablet Take 337.5-450 mg by mouth at bedtime. Takes 1 tab 3 nights weekly, takes 0.75 tab 4 nights weekly; no specific days.      . Memantine HCl ER (NAMENDA XR) 28 MG CP24 Take 1 capsule by mouth at bedtime.      . Multiple Vitamin (MULTIVITAMIN WITH MINERALS) TABS Take 1 tablet by mouth daily.      . primidone (MYSOLINE) 50 MG tablet Take 150 mg by mouth daily.       Marland Kitchen  traZODone (DESYREL) 50 MG tablet Take 50 mg by mouth at bedtime.      Marland Kitchen venlafaxine XR (EFFEXOR-XR) 75 MG 24 hr capsule Take 150 mg by mouth daily.        No current facility-administered medications on file prior to visit.    Allergies  Allergen Reactions  . Amoxicillin     REACTION: unknown  . Nitrofurantoin     REACTION: severe reaction, throat swelling  . Nitrofurantoin Monohyd Macro   . Omeprazole-Sodium Bicarbonate     REACTION: unknown  . Sulfa Antibiotics   . Sulfasalazine     REACTION: unknown    Review of Systems  Review of Systems  Constitutional: Negative for fever and malaise/fatigue.  HENT: Negative for congestion.   Eyes: Negative for discharge.  Respiratory: Positive for shortness of breath.   Cardiovascular: Positive for leg swelling. Negative for chest pain and palpitations.  Gastrointestinal: Negative for nausea, abdominal pain and diarrhea.  Genitourinary: Negative for dysuria.  Musculoskeletal: Negative for  falls.  Skin: Negative for rash.  Neurological: Negative for loss of consciousness and headaches.  Endo/Heme/Allergies: Negative for polydipsia.  Psychiatric/Behavioral: Negative for depression and suicidal ideas. The patient is not nervous/anxious and does not have insomnia.     Objective  BP 118/72  Pulse 98  Temp(Src) 98.2 F (36.8 C) (Oral)  Ht 4\' 9"  (1.448 m)  Wt 166 lb 0.6 oz (75.315 kg)  BMI 35.92 kg/m2  SpO2 95%  Physical Exam  Physical Exam  Constitutional: She is oriented to person, place, and time and well-developed, well-nourished, and in no distress. No distress.  HENT:  Head: Normocephalic and atraumatic.  Eyes: Conjunctivae are normal.  Neck: Neck supple. No thyromegaly present.  Cardiovascular: Normal rate, regular rhythm and normal heart sounds.   No murmur heard. Pulmonary/Chest: Effort normal and breath sounds normal. She has no wheezes.  Abdominal: She exhibits no distension and no mass.  Musculoskeletal: She exhibits edema.  2-3 cm open weeping sore on anterior tibial plateau no surrounding fluctuance. B/l LE very edematous with chronic venous stasis changes  Lymphadenopathy:    She has no cervical adenopathy.  Neurological: She is alert and oriented to person, place, and time.  Skin: Skin is warm and dry. No rash noted. She is not diaphoretic.  Psychiatric: Memory, affect and judgment normal.    Lab Results  Component Value Date   TSH 2.698 12/20/2011   Lab Results  Component Value Date   WBC 12.4* 06/11/2012   HGB 11.9* 06/11/2012   HCT 35.0* 06/11/2012   MCV 92.2 06/11/2012   PLT 288 06/11/2012   Lab Results  Component Value Date   CREATININE 0.90 06/11/2012   BUN 17 06/11/2012   NA 140 06/11/2012   K 3.8 06/11/2012   CL 105 06/11/2012   CO2 26 04/20/2012   Lab Results  Component Value Date   ALT 13 04/20/2012   AST 28 04/20/2012   ALKPHOS 82 04/20/2012   BILITOT 0.2* 04/20/2012   Lab Results  Component Value Date   CHOL 260* 12/20/2011   Lab Results   Component Value Date   HDL 60 12/20/2011   Lab Results  Component Value Date   LDLCALC 161* 12/20/2011   Lab Results  Component Value Date   TRIG 194* 12/20/2011   Lab Results  Component Value Date   CHOLHDL 4.3 12/20/2011     Assessment & Plan  LEG EDEMA Open lesion persistent on right lower extremity but erythema and signs of  infection have resolved with course of antibiotics. Agrees to restart lymphedema treatments via machine in home, is referred to [facility] health, [facility], for ongoing wound management  Cellulitis Signs of infection resolved  HYPOTHYROIDISM Well treated on current dose of Levothyroxine

## 2012-08-01 NOTE — Assessment & Plan Note (Signed)
Well treated on current dose of Levothyroxine 

## 2012-08-01 NOTE — Assessment & Plan Note (Signed)
Open lesion persistent on right lower extremity but erythema and signs of infection have resolved with course of antibiotics. Agrees to restart lymphedema treatments via machine in home, is referred to home health, [name], for ongoing wound management

## 2012-08-05 ENCOUNTER — Other Ambulatory Visit: Payer: Self-pay | Admitting: Family Medicine

## 2012-08-05 DIAGNOSIS — L0390 Cellulitis, unspecified: Secondary | ICD-10-CM

## 2012-08-05 MED ORDER — DOXYCYCLINE HYCLATE 100 MG PO TABS: 100.0000 mg | ORAL_TABLET | Freq: Two times a day (BID) | ORAL | Status: DC

## 2012-08-05 NOTE — Progress Notes (Signed)
Phone call from nurse Tammy with [name]. They have been managing her right lower extremity edema and open wound. With just 2 uses of her lymphedema pump her leg had been looking better but she's been refusing to use it again. The nurse noticed today her leg is more swollen and erythematous. We will restart antibiotics doxycycline 100 mg twice a day and reinforced the need for lymphedema pump. If symptoms worsen may need wound clinic or surgeon to reevaluate her leg.

## 2012-08-11 ENCOUNTER — Telehealth: Payer: Self-pay | Admitting: Family Medicine

## 2012-08-11 MED ORDER — LEVOTHYROXINE SODIUM 75 MCG PO TABS: 75.0000 ug | ORAL_TABLET | Freq: Every day | ORAL | Status: DC

## 2012-08-11 NOTE — Telephone Encounter (Signed)
LEVOTHYROXINE 75 MCG TABLET QTY 30 TAKE 1 BY MOUTH ONCE DAILY

## 2012-09-01 ENCOUNTER — Other Ambulatory Visit: Payer: Self-pay | Admitting: Family Medicine

## 2012-09-01 ENCOUNTER — Telehealth: Payer: Self-pay | Admitting: Family Medicine

## 2012-09-01 DIAGNOSIS — M1990 Unspecified osteoarthritis, unspecified site: Secondary | ICD-10-CM

## 2012-09-01 NOTE — Telephone Encounter (Signed)
Patient would like a referral to  Dr  Ancil Linsey,  Rheumatology for arthritis

## 2012-09-01 NOTE — Telephone Encounter (Signed)
Please advise 

## 2012-10-02 ENCOUNTER — Ambulatory Visit: Payer: Medicare Other | Admitting: Family Medicine

## 2012-10-07 ENCOUNTER — Encounter: Payer: Self-pay | Admitting: Family Medicine

## 2012-10-07 ENCOUNTER — Ambulatory Visit (INDEPENDENT_AMBULATORY_CARE_PROVIDER_SITE_OTHER): Payer: Medicare Other | Admitting: Family Medicine

## 2012-10-07 VITALS — BP 115/58 | HR 76 | Temp 98.3°F | Ht <= 58 in | Wt 162.1 lb

## 2012-10-07 DIAGNOSIS — Z5189 Encounter for other specified aftercare: Secondary | ICD-10-CM

## 2012-10-07 DIAGNOSIS — R413 Other amnesia: Secondary | ICD-10-CM

## 2012-10-07 DIAGNOSIS — J309 Allergic rhinitis, unspecified: Secondary | ICD-10-CM

## 2012-10-07 DIAGNOSIS — E039 Hypothyroidism, unspecified: Secondary | ICD-10-CM

## 2012-10-07 DIAGNOSIS — I890 Lymphedema, not elsewhere classified: Secondary | ICD-10-CM

## 2012-10-07 DIAGNOSIS — T7840XD Allergy, unspecified, subsequent encounter: Secondary | ICD-10-CM

## 2012-10-07 MED ORDER — FLUTICASONE PROPIONATE 50 MCG/ACT NA SUSP: 2.0000 | Freq: Every day | NASAL | Status: AC

## 2012-10-07 NOTE — Patient Instructions (Signed)
Can try [employer] such as MegaRed daily (goes on sale at [employer] frequently 2 for 1 and [employer] has a cheap price Consider a probiotic such as Digestive Advantage or Culturelle daily

## 2012-10-09 ENCOUNTER — Encounter: Payer: Self-pay | Admitting: Family Medicine

## 2012-10-09 DIAGNOSIS — R413 Other amnesia: Secondary | ICD-10-CM | POA: Insufficient documentation

## 2012-10-09 NOTE — Assessment & Plan Note (Signed)
Well treated. Continue current meds

## 2012-10-09 NOTE — Assessment & Plan Note (Addendum)
Encouraged daily lymphedema decompression, her husband is with her and reports she resists the treatments. She is advised that is what will help her not have skin breakdown again. Is seeing podiatry soon and they will ask about special made shoes and we can write an rx if they think we can help

## 2012-10-09 NOTE — Assessment & Plan Note (Signed)
Does feel her Aricept is helping but they will be seeing a geriatrician soon

## 2012-10-09 NOTE — Progress Notes (Signed)
Patient ID: [name], female   DOB: [birth year], 78 y.o.   MRN: [id number] [name] [id number] 02-26-1935 10/09/2012      Progress Note-Follow Up  Subjective  Chief Complaint  Chief Complaint  Patient presents with  . Follow-up    2 month    HPI  Patient is a 78 female Caucasian female who is in today with her husband. Her skin breakdown has resolved but she is still persisting using her machine for her lymphedema. She is having trouble finding shoes that fit her at this point without making her future due to her significant lymphedema. Her other complaint nasal discharge. Generally clears occasionally grey. Some itchy watery eyes also noted. No fevers or chills. No shortness or breath GI or GU complaints.   Past Medical History  Diagnosis Date  . GERD (gastroesophageal reflux disease)   . Hypothyroidism   . Bipolar 1 disorder   . Obesity   . Anemia   . Pulmonary nodule   . Tremors of nervous system   . Memory loss   . Urinary incontinence   . Choking   . Cellulitis 07/07/2012    Past Surgical History  Procedure Laterality Date  . D&c x2      Family History  Problem Relation Age of Onset  . Stroke Mother     History   Social History  . Marital Status: Married    Spouse Name: N/A    Number of Children: 2  . Years of Education: N/A   Occupational History  . Retired Dipl[employer] Services operational officer    Social History Main Topics  . Smoking status: Never Smoker   . Smokeless tobacco: Not on file  . Alcohol Use: No  . Drug Use: No  . Sexually Active: Not on file   Other Topics Concern  . Not on file   Social History Narrative   ** Merged History Encounter **        Current Outpatient Prescriptions on File Prior to Visit  Medication Sig Dispense Refill  . aspirin EC 81 MG tablet Take 81 mg by mouth daily.      Marland Kitchen buPROPion (WELLBUTRIN SR) 150 MG 12 hr tablet Take 150 mg by mouth daily.       Marland Kitchen donepezil (ARICEPT) 10 MG tablet Take 20 mg by mouth daily.      .  ferrous sulfate 324 (65 FE) MG TBEC Take 1 tablet by mouth daily.       Marland Kitchen levothyroxine (SYNTHROID, LEVOTHROID) 75 MCG tablet Take 1 tablet (75 mcg total) by mouth daily before breakfast.  30 tablet  2  . lithium carbonate (ESKALITH) 450 MG CR tablet Take 337.5-450 mg by mouth at bedtime. Takes 1 tab 3 nights weekly, takes 0.75 tab 4 nights weekly; no specific days.      . Memantine HCl ER (NAMENDA XR) 28 MG CP24 Take 1 capsule by mouth at bedtime.      . Multiple Vitamin (MULTIVITAMIN WITH MINERALS) TABS Take 1 tablet by mouth daily.      . primidone (MYSOLINE) 50 MG tablet Take 150 mg by mouth daily.       Marland Kitchen saccharomyces boulardii (FLORASTOR) 250 MG capsule Take 1 capsule (250 mg total) by mouth 2 (two) times daily.  60 capsule  3  . traZODone (DESYREL) 50 MG tablet Take 50 mg by mouth at bedtime.      Marland Kitchen venlafaxine XR (EFFEXOR-XR) 75 MG 24 hr capsule Take 150 mg by mouth daily.  No current facility-administered medications on file prior to visit.    Allergies  Allergen Reactions  . Amoxicillin     REACTION: unknown  . Nitrofurantoin     REACTION: severe reaction, throat swelling  . Nitrofurantoin Monohyd Macro   . Omeprazole-Sodium Bicarbonate     REACTION: unknown  . Sulfa Antibiotics   . Sulfasalazine     REACTION: unknown    Review of Systems  Review of Systems  Constitutional: Negative for fever and malaise/fatigue.  HENT: Negative for congestion.   Eyes: Negative for pain and discharge.  Respiratory: Negative for shortness of breath.   Cardiovascular: Negative for chest pain, palpitations and leg swelling.  Gastrointestinal: Negative for nausea, abdominal pain and diarrhea.  Genitourinary: Negative for dysuria.  Musculoskeletal: Negative for falls.  Skin: Negative for rash.  Neurological: Negative for loss of consciousness and headaches.  Endo/Heme/Allergies: Negative for polydipsia.  Psychiatric/Behavioral: Negative for depression and suicidal ideas. The  patient is not nervous/anxious and does not have insomnia.     Objective  BP 115/58  Pulse 76  Temp(Src) 98.3 F (36.8 C) (Oral)  Ht 4\' 9"  (1.448 m)  Wt 162 lb 1.3 oz (73.519 kg)  BMI 35.06 kg/m2  SpO2 95%  Physical Exam  Physical Exam  Constitutional: She is oriented to person, place, and time and well-developed, well-nourished, and in no distress. No distress.  HENT:  Head: Normocephalic and atraumatic.  Eyes: Conjunctivae are normal.  Neck: Neck supple. No thyromegaly present.  Cardiovascular: Normal rate, regular rhythm and normal heart sounds.   No murmur heard. Pulmonary/Chest: Effort normal and breath sounds normal. She has no wheezes.  Abdominal: She exhibits no distension and no mass.  Musculoskeletal: She exhibits no edema.  Lymphadenopathy:    She has no cervical adenopathy.  Neurological: She is alert and oriented to person, place, and time.  Skin: Skin is warm and dry. No rash noted. She is not diaphoretic.  Psychiatric: Memory, affect and judgment normal.    Lab Results  Component Value Date   TSH 2.698 12/20/2011   Lab Results  Component Value Date   WBC 12.4* 06/11/2012   HGB 11.9* 06/11/2012   HCT 35.0* 06/11/2012   MCV 92.2 06/11/2012   PLT 288 06/11/2012   Lab Results  Component Value Date   CREATININE 0.90 06/11/2012   BUN 17 06/11/2012   NA 140 06/11/2012   K 3.8 06/11/2012   CL 105 06/11/2012   CO2 26 04/20/2012   Lab Results  Component Value Date   ALT 13 04/20/2012   AST 28 04/20/2012   ALKPHOS 82 04/20/2012   BILITOT 0.2* 04/20/2012   Lab Results  Component Value Date   CHOL 260* 12/20/2011   Lab Results  Component Value Date   HDL 60 12/20/2011   Lab Results  Component Value Date   LDLCALC 161* 12/20/2011   Lab Results  Component Value Date   TRIG 194* 12/20/2011   Lab Results  Component Value Date   CHOLHDL 4.3 12/20/2011     Assessment & Plan  HYPOTHYROIDISM Well treated. Continue current meds  Chronic acquired  lymphedema Encouraged daily lymphedema decompression, her husband is with her and reports she resists the treatments. She is advised that is what will help her not have skin breakdown again. Is seeing podiatry soon and they will ask about special made shoes and we can write an rx if they think we can help  ALLERGIC RHINITIS Start Fluticasone and Loratadine  Memory loss Does  feel her Aricept is helping but they will be seeing a geriatrician soon

## 2012-10-09 NOTE — Assessment & Plan Note (Signed)
Start Fluticasone and Loratadine

## 2012-11-03 ENCOUNTER — Telehealth: Payer: Self-pay | Admitting: Family Medicine

## 2012-11-03 NOTE — Telephone Encounter (Signed)
So if he calls back just warn him I will not have time for a phone call such as this til Weds afternoon. If he wants to give [place] a little more direction on what he needs I might be able to help quicker

## 2012-11-03 NOTE — Telephone Encounter (Signed)
Please advise 

## 2012-11-03 NOTE — Telephone Encounter (Signed)
Patients husband called in stating that he would like to talk to Dr. Abner Greenspan about patients lymphedema and Rx for shoes. I explained to him that a nurse would call him regarding this and he states that he will not talk to a nurse, only Dr. Abner Greenspan.

## 2012-11-09 ENCOUNTER — Other Ambulatory Visit: Payer: Self-pay | Admitting: Family Medicine

## 2012-11-11 NOTE — Telephone Encounter (Signed)
Did you ever call patients spouse?

## 2012-11-11 NOTE — Telephone Encounter (Signed)
I accidentally just hit done on this, please send it back with a phone number and his name so I can call him

## 2012-11-14 ENCOUNTER — Encounter: Payer: Self-pay | Admitting: Family Medicine

## 2012-11-14 NOTE — Telephone Encounter (Signed)
Spoke with husband they are just hoping to get help with some special shoes she needs form the podiatrist, because of her lymphedema she needs very large special shoes. Insurance will not pay for them unless she has diabetes. She has had a high sugar once so when she comes in soon we will check a [id number] to see if she qualifies

## 2012-11-19 ENCOUNTER — Telehealth: Payer: Self-pay

## 2012-11-19 NOTE — Telephone Encounter (Signed)
FYI: Patients spouse called and left a message stating that he had some medical questions about patients appt.  I called patients spouse Casimiro Needle) and he stated that his wife is going to be going to the [facility] but they need the last 2 years of patients records.   I informed patient that he would need to come here to have that office send [place] a release of records form. Before I could finish pts spouse started getting upset and told me that the other office told him to call us to get the records. I tried (but kept getting interrupted) explaining that we do this to protect the privacy of the patient and these are the protocols. Pts spouse stated that was a bunch of "crap" and he was sick of dealing with "[place]". I told him I was sorry and I understood but I could loose my job and he said he didn't care it was his information. I said again I'm sorry but I can't release the information without a consent sent to [place]. Patients spouse then asked to speak to my supervisor. I gave him Brian's name and phone number. The patient said ok I'm calling your boss. I said ok, I'm sorry again but theres nothing I can do. Patient said something (I couldn't understand) then hung up the phone.

## 2012-11-21 NOTE — Telephone Encounter (Signed)
This encounter was opened in error by the provider and closed at her request.

## 2012-11-25 ENCOUNTER — Telehealth: Payer: Self-pay | Admitting: Family Medicine

## 2012-11-25 NOTE — Telephone Encounter (Signed)
Patients husband, Casimiro Needle presented asking if records for his wife could be faxed to [facility], attention Dorothe Pea, CNA. Mr. Casimiro Needle presented a green card with all information to the Clinic. I called the clinic and verified appointment for Wednesday 11/26/12 and the need for 2 years worth of records. Verified fax number, records sent. Called patient and husband as requested to notify of completion as requested on designated party form. rmf 11/25/12.

## 2013-01-08 ENCOUNTER — Ambulatory Visit (INDEPENDENT_AMBULATORY_CARE_PROVIDER_SITE_OTHER): Payer: Medicare Other | Admitting: Family Medicine

## 2013-01-08 ENCOUNTER — Telehealth: Payer: Self-pay | Admitting: Family Medicine

## 2013-01-08 ENCOUNTER — Encounter: Payer: Self-pay | Admitting: Family Medicine

## 2013-01-08 ENCOUNTER — Telehealth: Payer: Self-pay

## 2013-01-08 VITALS — BP 110/80 | HR 66 | Temp 98.2°F | Ht <= 58 in | Wt 157.1 lb

## 2013-01-08 DIAGNOSIS — M21969 Unspecified acquired deformity of unspecified lower leg: Secondary | ICD-10-CM

## 2013-01-08 DIAGNOSIS — R413 Other amnesia: Secondary | ICD-10-CM

## 2013-01-08 DIAGNOSIS — M21961 Unspecified acquired deformity of right lower leg: Secondary | ICD-10-CM

## 2013-01-08 DIAGNOSIS — E785 Hyperlipidemia, unspecified: Secondary | ICD-10-CM

## 2013-01-08 DIAGNOSIS — I890 Lymphedema, not elsewhere classified: Secondary | ICD-10-CM

## 2013-01-08 DIAGNOSIS — E039 Hypothyroidism, unspecified: Secondary | ICD-10-CM

## 2013-01-08 DIAGNOSIS — Z23 Encounter for immunization: Secondary | ICD-10-CM

## 2013-01-08 HISTORY — DX: Unspecified acquired deformity of right lower leg: M21.961

## 2013-01-08 HISTORY — DX: Lymphedema, not elsewhere classified: I89.0

## 2013-01-08 MED ORDER — SILVER SULFADIAZINE 1 % EX CREA: TOPICAL_CREAM | Freq: Every day | CUTANEOUS | Status: DC | PRN

## 2013-01-08 NOTE — Telephone Encounter (Signed)
Yes they have been using this at home for months from an old prescription they had at home from another doctor. Thanks for checking

## 2013-01-08 NOTE — Telephone Encounter (Signed)
Gina informed  

## 2013-01-08 NOTE — Telephone Encounter (Signed)
Lab order week of 03-31-2013 Labs prior to visit lipid, renal, cbc, tsh, hepatic

## 2013-01-08 NOTE — Telephone Encounter (Signed)
Gina with [facility] called to state that pt is allergic to sulfa so they want to make sure its ok for them to fill the RX for Silver Sulfadiazine.  Please advise?

## 2013-01-08 NOTE — Patient Instructions (Signed)
Lymphedema Lymphedema is a swelling caused by the abnormal collection of lymph under the skin. The lymph is fluid from the tissues in your body that travels in the lymphatic system. This system is part of the immune system that includes lymph nodes and vessels. The lymph vessels collect and carry the excess fluid, fats, proteins, and wastes from the tissues of the body to the bloodstream. This system also works to clean and remove bacteria and waste products from the body.  Lymphedema occurs when the lymphatic system is blocked. When the lymph vessels or lymph nodes are blocked or damaged, lymph does not drain properly. This causes abnormal build up of lymph. This leads to swelling in the arms or legs. Lymphedema cannot be cured by medicines. But the swelling can be reduced by physical methods. CAUSES  There are two types of lymphedema. Primary lymphedema is caused by the absence or abnormality of the lymph vessel at birth. It is also known as inherited lymphedema, which occurs rarely. Secondary or acquired lymphedema occurs when the lymph vessel is damaged or blocked. The causes of lymph vessel blockage are:   Skin infection like cellulites.  Infection by parasites (filariasis).  Injury.  Cancer.  Radiation therapy.  Formation of scar tissue.  Surgery. SYMPTOMS  The symptoms of lymphedema are:  Abnormal swelling of the arm or leg.  Heavy or tight feeling in your arm or leg.  Tight-fitting shoes or rings.  Redness of skin over the affected area.  Limited movement of the affected limb.  Some patients complain about sensitivity to touch and discomfort in the limb(s) affected. You may not have these symptoms immediately following injury. They usually appear within a few days or even years after injury. Inform your caregiver, if you have any of these symptoms. Early treatment can avoid further problems.  DIAGNOSIS  First, your caregiver will inquire about any surgery you have had or  medicines you are taking. He will then examine you. Your caregiver may order special imaging tests, such as:  Lymphoscintigraphy (a test in which a low dose of radioactive substance is injected to trace the flow of lymph through the lymph vessels).  MRI (imaging tests using magnetic fields).  Computed tomography (test using special cross-sectional X-rays).  Duplex ultrasound (test using high-frequency sound waves to show the vessels and the blood flow on a screen).  Lymphangiography (special X-ray taken after injecting a contrast dye into the lymph vessel). It is now rarely done. TREATMENT  Lymphedema can be treated in different ways. Your caregiver will decide the type of treatment depending on the cause. Treatment may include:  Exercise: Special exercises will help fluid move out easily from the affected part. This should be done as per your caregiver's advice.  Manual lymph drainage: Gentle massage of the affected limb makes the fluid to move out more freely.  Compression: Compression stockings or external pump apply pressure over the affected limb. This helps the fluid to move out from the arm or leg. Bandaging can also help to move the fluid out from the affected part. Your caregiver will decide the method that suits you the best.  Medicines: Your caregiver may prescribe antibiotics, if you have infection.  Surgery: Your caregiver may advise surgery for severe lymphedema. It is reserved for special cases when the patient has difficulty moving. Your surgeon may remove excess tissue from the arm or leg. This will help to ease your movement. Physical therapy may have to be continued after surgery. HOME CARE INSTRUCTIONS    The area is very fragile and is predisposed to injury and infection.  Eat a healthy diet.  Exercise regularly as per advice.  Keep the affected area clean and dry.  Use gloves while cooking or gardening.  Protect your skin from cuts.  Use electric razor to  shave the affected area.  Keep affected limb elevated.  Do not wear tight clothes, shoes, or jewelry as it may cause the tissue to be strangled.  Do not use heat pads over the affected area.  Do not sit with cross legs.  Do not walk barefoot.  Do not carry weight on the affected arm.  Avoid having blood pressure checked on the affected limb. SEEK MEDICAL CARE IF:  You continue to have swelling in your limb. SEEK IMMEDIATE MEDICAL CARE IF:   You have high fever.  You have skin rash.  You have chills or sweats.  You have pain or redness.  You have a cut that does not heal. MAKE SURE YOU:   Understand these instructions.  Will watch your condition.  Will get help right away if you are not doing well or get worse. Document Released: 12/24/2006 Document Revised: 02/13/2012 Document Reviewed: 11/29/2008 [employer] Patient Information 2014 [employer].  

## 2013-01-09 ENCOUNTER — Encounter: Payer: Self-pay | Admitting: Family Medicine

## 2013-01-09 NOTE — Assessment & Plan Note (Signed)
Has appt with Geriatrician next week. Stable at this time

## 2013-01-09 NOTE — Progress Notes (Signed)
Patient ID: [name], female   DOB: 28-Nov-1934, 78 y.o.   MRN: [id number] [name] [id number] 1934/07/26 01/09/2013      Progress Note-Follow Up  Subjective  Chief Complaint  Chief Complaint  Patient presents with  . Follow-up    3 month  . Injections    flu- high dose    HPI  Caucasian female here today with her husband. She has significant swelling and discomfort in her feet. Frequent skin breakdown is noted. They are in the process of giving her some special shoes made in her asking a form be filled out. Otherwise she's doing well. She is using her machine to help with her lymphedema. No recent illness. No chest pain, palpitations, shortness of breath, GI or GU concerns.  Past Medical History  Diagnosis Date  . GERD (gastroesophageal reflux disease)   . Hypothyroidism   . Bipolar 1 disorder   . Obesity   . Anemia   . Pulmonary nodule   . Tremors of nervous system   . Memory loss   . Urinary incontinence   . Choking   . Cellulitis 07/07/2012  . Lymphedema 01/08/2013  . Foot deformity, bilateral 01/08/2013    Past Surgical History  Procedure Laterality Date  . D&c x2      Family History  Problem Relation Age of Onset  . Stroke Mother   . Hypertension Mother   . Asthma Daughter   . Arthritis Daughter     History   Social History  . Marital Status: Married    Spouse Name: N/A    Number of Children: 2  . Years of Education: N/A   Occupational History  . Retired [employer] operational officer    Social History Main Topics  . Smoking status: Never Smoker   . Smokeless tobacco: Not on file  . Alcohol Use: No  . Drug Use: No  . Sexual Activity: Not on file   Other Topics Concern  . Not on file   Social History Narrative   ** Merged History Encounter **        Current Outpatient Prescriptions on File Prior to Visit  Medication Sig Dispense Refill  . aspirin EC 81 MG tablet Take 81 mg by mouth daily.      . [place]wellia-Glucosamine-Vit D (GLUCOSAMINE COMPLEX  PO) Take by mouth.      Marland Kitchen buPROPion (WELLBUTRIN SR) 150 MG 12 hr tablet Take 150 mg by mouth daily.       Marland Kitchen donepezil (ARICEPT) 10 MG tablet Take 20 mg by mouth daily.      . ferrous sulfate 324 (65 FE) MG TBEC Take 1 tablet by mouth daily.       . fish oil-omega-3 fatty acids 1000 MG capsule Take 1,000 mg by mouth daily.      . fluticasone (FLONASE) 50 MCG/ACT nasal spray Place 2 sprays into the nose daily.  16 g  6  . levothyroxine (SYNTHROID, LEVOTHROID) 75 MCG tablet take 1 tablet by mouth once daily before BREAKFAST.  30 tablet  2  . lithium carbonate (ESKALITH) 450 MG CR tablet Take 337.5-450 mg by mouth at bedtime. Takes 1 tab 3 nights weekly, takes 0.75 tab 4 nights weekly; no specific days.      . Memantine HCl ER (NAMENDA XR) 28 MG CP24 Take 1 capsule by mouth at bedtime.      . Multiple Vitamin (MULTIVITAMIN WITH MINERALS) TABS Take 1 tablet by mouth daily.      . primidone (  MYSOLINE) 50 MG tablet Take 150 mg by mouth daily.       Marland Kitchen saccharomyces boulardii (FLORASTOR) 250 MG capsule Take 1 capsule (250 mg total) by mouth 2 (two) times daily.  60 capsule  3  . traZODone (DESYREL) 50 MG tablet Take 50 mg by mouth at bedtime.      Marland Kitchen venlafaxine XR (EFFEXOR-XR) 75 MG 24 hr capsule Take 150 mg by mouth daily.        No current facility-administered medications on file prior to visit.    Allergies  Allergen Reactions  . Amoxicillin     REACTION: unknown  . Nitrofurantoin     REACTION: severe reaction, throat swelling  . Nitrofurantoin Monohyd Macro   . Omeprazole-Sodium Bicarbonate     REACTION: unknown  . Sulfa Antibiotics   . Sulfasalazine     REACTION: unknown    Review of Systems  Review of Systems  Constitutional: Negative for fever and malaise/fatigue.  HENT: Negative for congestion.   Eyes: Negative for discharge.  Respiratory: Negative for shortness of breath.   Cardiovascular: Positive for leg swelling. Negative for chest pain and palpitations.  Gastrointestinal:  Negative for nausea, abdominal pain and diarrhea.  Genitourinary: Negative for dysuria.  Musculoskeletal: Positive for joint pain. Negative for falls.       B/l foot pain, swelling  Skin: Negative for rash.  Neurological: Negative for loss of consciousness and headaches.  Endo/Heme/Allergies: Negative for polydipsia.  Psychiatric/Behavioral: Positive for memory loss. Negative for depression and suicidal ideas. The patient is not nervous/anxious and does not have insomnia.     Objective  BP 110/80  Pulse 66  Temp(Src) 98.2 F (36.8 C) (Oral)  Ht 4\' 9"  (1.448 m)  Wt 157 lb 1.3 oz (71.251 kg)  BMI 33.98 kg/m2  SpO2 98%  Physical Exam  Physical Exam  Constitutional: She is oriented to person, place, and time and well-developed, well-nourished, and in no distress. No distress.  HENT:  Head: Normocephalic and atraumatic.  Eyes: Conjunctivae are normal.  Neck: Neck supple. No thyromegaly present.  Cardiovascular: Normal rate and regular rhythm.   Murmur heard. Pulmonary/Chest: Effort normal and breath sounds normal. She has no wheezes.  Abdominal: She exhibits no distension and no mass.  Musculoskeletal: She exhibits no edema.  Lymphadenopathy:    She has no cervical adenopathy.  Neurological: She is alert and oriented to person, place, and time.  Skin: Skin is warm and dry. No rash noted. She is not diaphoretic.  Psychiatric: Memory, affect and judgment normal.    Lab Results  Component Value Date   TSH 2.698 12/20/2011   Lab Results  Component Value Date   WBC 12.4* 06/11/2012   HGB 11.9* 06/11/2012   HCT 35.0* 06/11/2012   MCV 92.2 06/11/2012   PLT 288 06/11/2012   Lab Results  Component Value Date   CREATININE 0.90 06/11/2012   BUN 17 06/11/2012   NA 140 06/11/2012   K 3.8 06/11/2012   CL 105 06/11/2012   CO2 26 04/20/2012   Lab Results  Component Value Date   ALT 13 04/20/2012   AST 28 04/20/2012   ALKPHOS 82 04/20/2012   BILITOT 0.2* 04/20/2012   Lab Results  Component Value  Date   CHOL 260* 12/20/2011   Lab Results  Component Value Date   HDL 60 12/20/2011   Lab Results  Component Value Date   LDLCALC 161* 12/20/2011   Lab Results  Component Value Date   TRIG 194* 12/20/2011  Lab Results  Component Value Date   CHOLHDL 4.3 12/20/2011   2  Assessment & Plan  HYPOTHYROIDISM Headed to [facility] to meet a Geriatrician next week. Will hold off on labs til we see what they do and repeat labs with next visit accordingly.  Memory loss Has appt with Geriatrician next week. Stable at this time  Foot deformity, bilateral With this and chronic significant lymphedema would benefit from extra depth custom made shoes. Form filled out for family to take in for custom shoes.

## 2013-01-09 NOTE — Assessment & Plan Note (Signed)
With this and chronic significant lymphedema would benefit from extra depth custom made shoes. Form filled out for family to take in for custom shoes.

## 2013-01-09 NOTE — Assessment & Plan Note (Signed)
Headed to [facility] to meet a Geriatrician next week. Will hold off on labs til we see what they do and repeat labs with next visit accordingly.

## 2013-01-23 ENCOUNTER — Telehealth: Payer: Self-pay | Admitting: *Deleted

## 2013-01-23 NOTE — Telephone Encounter (Signed)
Patient's spouse called RE: status of the Geriatric Clinic at [facility] faxed paperwork over to our office suggesting that PCP make referral for Home Health Care, including PT, OT & Home Nurse/SLS Have you received this paperwork, please advise caller.

## 2013-01-23 NOTE — Telephone Encounter (Signed)
I have not seen any paperwork yet 

## 2013-01-26 NOTE — Telephone Encounter (Signed)
Spoke to Amy at the Geriatric Clinic at [facility] [phone] . Last ov for patient was 01/15/13, consultation with Dr. Alfredo Bach. Amy will fax over that office visit note.

## 2013-01-26 NOTE — Telephone Encounter (Signed)
Received records from Geriatric Clinic at [facility].

## 2013-01-26 NOTE — Telephone Encounter (Signed)
See previous notes.

## 2013-01-26 NOTE — Telephone Encounter (Signed)
Patients husband is calling cussing at me and stating that we don't do a "damn" thing here and is going to report [place] to the medical board. I asked the pts spouse to please not talk to me that way. Pts spouse states he will speak to me how he wants to. I informed pt that he will need to have [facility] send this information to [place] because we have not received it. He stated that we need to that we don't do anything (to be nice). I asked pt again not to speak to me like that. He stated that he has been calling since last week. I tried to apologize but tell him I was out of the office last week except part of Friday.

## 2013-01-26 NOTE — Telephone Encounter (Signed)
Paperwork from [facility] put on MD's desk

## 2013-01-27 ENCOUNTER — Other Ambulatory Visit: Payer: Self-pay | Admitting: Family Medicine

## 2013-01-27 DIAGNOSIS — R52 Pain, unspecified: Secondary | ICD-10-CM

## 2013-01-27 DIAGNOSIS — R2681 Unsteadiness on feet: Secondary | ICD-10-CM

## 2013-01-27 DIAGNOSIS — R609 Edema, unspecified: Secondary | ICD-10-CM

## 2013-01-27 DIAGNOSIS — R5381 Other malaise: Secondary | ICD-10-CM

## 2013-02-16 ENCOUNTER — Other Ambulatory Visit: Payer: Self-pay | Admitting: Family Medicine

## 2013-02-19 ENCOUNTER — Telehealth: Payer: Self-pay | Admitting: Family Medicine

## 2013-02-19 NOTE — Telephone Encounter (Signed)
Blisters on Right leg, requesting verbal order for [place] boots. Can leave detailed message

## 2013-02-19 NOTE — Telephone Encounter (Signed)
Please advise 

## 2013-02-19 NOTE — Telephone Encounter (Signed)
Who is requesting the prescription? The patient and husband? Or home health? If home health Ok to prescribe, if patient have they used one  Before?

## 2013-02-20 NOTE — Telephone Encounter (Signed)
It says Insurance risk surveyor.  I'm trying to call Tammy to give ok on verbal but it states: The number I'm trying to reach is not reachable?   I have tried twice.  I will try again later

## 2013-02-25 NOTE — Telephone Encounter (Signed)
Patient husband called back on this and is very upset. He states that [place] has never received a call on this. I explained to him that we have tried calling but that the number is not reachable. He states that home health is requesting this rx. He would like another nurse to call Genevieve Norlander on this and he is requesting that we call him back once this has been done. His cell # [phone]

## 2013-02-25 NOTE — Telephone Encounter (Signed)
Called and spoke with representative at [facility] who informed me that Occupational therapy went out to patient's home yesterday, 12.16.14 and Nursing was out at patient's home today, 12.17.14, and did wound care. I am not sure if pt's husband is referring to the previously requested Rx for [place], Genevieve Norlander states they have not received any request for this and would like a call back regarding this matter upon PCP return to office on Thurs, 12.18.14; so they can determine what, if anything new, is needed for patient's care/SLS Please Advise.

## 2013-02-26 NOTE — Telephone Encounter (Signed)
I spoke to the manager at [facility] and she stated that she had spoke to [facility] and she stated pt no longer needs this. She did take a verbal order incase pt would need this in the future. The manager apologized a couple times for [place] not being able to reach Tammy and stated she would talk to [facility] and have her call the pt.

## 2013-04-07 ENCOUNTER — Ambulatory Visit: Payer: Medicare Other | Admitting: Family Medicine

## 2013-05-09 ENCOUNTER — Emergency Department (HOSPITAL_COMMUNITY): Payer: Medicare Other

## 2013-05-09 ENCOUNTER — Inpatient Hospital Stay (HOSPITAL_COMMUNITY)
Admission: EM | Admit: 2013-05-09 | Discharge: 2013-05-12 | DRG: 640 | Disposition: A | Discharge status: Skilled Nursing Facility | Payer: Medicare Other | Attending: Internal Medicine | Admitting: Internal Medicine

## 2013-05-09 ENCOUNTER — Encounter (HOSPITAL_COMMUNITY): Payer: Self-pay | Admitting: Emergency Medicine

## 2013-05-09 DIAGNOSIS — L0390 Cellulitis, unspecified: Secondary | ICD-10-CM

## 2013-05-09 DIAGNOSIS — Z7982 Long term (current) use of aspirin: Secondary | ICD-10-CM

## 2013-05-09 DIAGNOSIS — M21961 Unspecified acquired deformity of right lower leg: Secondary | ICD-10-CM

## 2013-05-09 DIAGNOSIS — E039 Hypothyroidism, unspecified: Secondary | ICD-10-CM | POA: Diagnosis present

## 2013-05-09 DIAGNOSIS — F0390 Unspecified dementia without behavioral disturbance: Secondary | ICD-10-CM | POA: Diagnosis present

## 2013-05-09 DIAGNOSIS — N390 Urinary tract infection, site not specified: Secondary | ICD-10-CM | POA: Diagnosis present

## 2013-05-09 DIAGNOSIS — Z8673 Personal history of transient ischemic attack (TIA), and cerebral infarction without residual deficits: Secondary | ICD-10-CM

## 2013-05-09 DIAGNOSIS — Z66 Do not resuscitate: Secondary | ICD-10-CM | POA: Diagnosis present

## 2013-05-09 DIAGNOSIS — R443 Hallucinations, unspecified: Secondary | ICD-10-CM | POA: Diagnosis present

## 2013-05-09 DIAGNOSIS — R627 Adult failure to thrive: Secondary | ICD-10-CM | POA: Diagnosis present

## 2013-05-09 DIAGNOSIS — G9340 Encephalopathy, unspecified: Secondary | ICD-10-CM | POA: Diagnosis present

## 2013-05-09 DIAGNOSIS — J309 Allergic rhinitis, unspecified: Secondary | ICD-10-CM

## 2013-05-09 DIAGNOSIS — E860 Dehydration: Principal | ICD-10-CM | POA: Diagnosis present

## 2013-05-09 DIAGNOSIS — E785 Hyperlipidemia, unspecified: Secondary | ICD-10-CM

## 2013-05-09 DIAGNOSIS — G250 Essential tremor: Secondary | ICD-10-CM | POA: Diagnosis present

## 2013-05-09 DIAGNOSIS — M21962 Unspecified acquired deformity of left lower leg: Secondary | ICD-10-CM

## 2013-05-09 DIAGNOSIS — R609 Edema, unspecified: Secondary | ICD-10-CM

## 2013-05-09 DIAGNOSIS — F319 Bipolar disorder, unspecified: Secondary | ICD-10-CM | POA: Diagnosis present

## 2013-05-09 DIAGNOSIS — J310 Chronic rhinitis: Secondary | ICD-10-CM

## 2013-05-09 DIAGNOSIS — R5381 Other malaise: Secondary | ICD-10-CM | POA: Diagnosis present

## 2013-05-09 DIAGNOSIS — D649 Anemia, unspecified: Secondary | ICD-10-CM | POA: Diagnosis present

## 2013-05-09 DIAGNOSIS — R32 Unspecified urinary incontinence: Secondary | ICD-10-CM

## 2013-05-09 DIAGNOSIS — I87309 Chronic venous hypertension (idiopathic) without complications of unspecified lower extremity: Secondary | ICD-10-CM

## 2013-05-09 DIAGNOSIS — IMO0002 Reserved for concepts with insufficient information to code with codable children: Secondary | ICD-10-CM

## 2013-05-09 DIAGNOSIS — Z9181 History of falling: Secondary | ICD-10-CM

## 2013-05-09 DIAGNOSIS — R011 Cardiac murmur, unspecified: Secondary | ICD-10-CM

## 2013-05-09 DIAGNOSIS — Z8249 Family history of ischemic heart disease and other diseases of the circulatory system: Secondary | ICD-10-CM

## 2013-05-09 DIAGNOSIS — G252 Other specified forms of tremor: Secondary | ICD-10-CM

## 2013-05-09 DIAGNOSIS — Z79899 Other long term (current) drug therapy: Secondary | ICD-10-CM

## 2013-05-09 DIAGNOSIS — K219 Gastro-esophageal reflux disease without esophagitis: Secondary | ICD-10-CM | POA: Diagnosis present

## 2013-05-09 DIAGNOSIS — R413 Other amnesia: Secondary | ICD-10-CM

## 2013-05-09 DIAGNOSIS — I890 Lymphedema, not elsewhere classified: Secondary | ICD-10-CM | POA: Diagnosis present

## 2013-05-09 DIAGNOSIS — Z823 Family history of stroke: Secondary | ICD-10-CM

## 2013-05-09 DIAGNOSIS — J449 Chronic obstructive pulmonary disease, unspecified: Secondary | ICD-10-CM

## 2013-05-09 DIAGNOSIS — S0191XA Laceration without foreign body of unspecified part of head, initial encounter: Secondary | ICD-10-CM

## 2013-05-09 LAB — COMPREHENSIVE METABOLIC PANEL
ALK PHOS: 77 U/L (ref 39–117)
ALT: 25 U/L (ref 0–35)
AST: 21 U/L (ref 0–37)
Albumin: 2.7 g/dL — ABNORMAL LOW (ref 3.5–5.2)
BUN: 21 mg/dL (ref 6–23)
CO2: 25 mEq/L (ref 19–32)
CREATININE: 0.8 mg/dL (ref 0.50–1.10)
Calcium: 10.8 mg/dL — ABNORMAL HIGH (ref 8.4–10.5)
Chloride: 100 mEq/L (ref 96–112)
GFR calc non Af Amer: 70 mL/min — ABNORMAL LOW (ref >90)
GFR, EST AFRICAN AMERICAN: 81 mL/min — AB (ref >90)
GLUCOSE: 118 mg/dL — AB (ref 70–99)
POTASSIUM: 3.8 meq/L (ref 3.7–5.3)
Sodium: 137 mEq/L (ref 137–147)
TOTAL PROTEIN: 6.6 g/dL (ref 6.0–8.3)
Total Bilirubin: <0.2 mg/dL — ABNORMAL LOW (ref 0.3–1.2)

## 2013-05-09 LAB — CBC
HEMATOCRIT: 30.2 % — AB (ref 36.0–46.0)
HEMOGLOBIN: 9.6 g/dL — AB (ref 12.0–15.0)
MCH: 27.9 pg (ref 26.0–34.0)
MCHC: 31.8 g/dL (ref 30.0–36.0)
MCV: 87.8 fL (ref 78.0–100.0)
Platelets: 353 10*3/uL (ref 150–400)
RBC: 3.44 MIL/uL — ABNORMAL LOW (ref 3.87–5.11)
RDW: 13.6 % (ref 11.5–15.5)
WBC: 10.9 10*3/uL — ABNORMAL HIGH (ref 4.0–10.5)

## 2013-05-09 LAB — CBG MONITORING, ED: GLUCOSE-CAPILLARY: 127 mg/dL — AB (ref 70–99)

## 2013-05-09 MED ORDER — SODIUM CHLORIDE 0.9 % IV BOLUS (SEPSIS)
500.0000 mL | Freq: Once | INTRAVENOUS | Status: AC
  Administered 2013-05-09: 500 mL via INTRAVENOUS

## 2013-05-09 NOTE — ED Notes (Signed)
Attempted in and out catheter, unable to gather any urine at this time. The ptient is getting fluid bolus.

## 2013-05-09 NOTE — ED Provider Notes (Signed)
CSN: [id number]     Arrival date & time 05/09/13  4098 History   First MD Initiated Contact with Patient 05/09/13 1859     Chief Complaint  Patient presents with  . Weakness     (Consider location/radiation/quality/duration/timing/severity/associated sxs/prior Treatment) HPI.....Marland Kitchen level V caveat for early dementia.  History obtained from daughter and husband.   Patient has been weak, decreased oral intake, unable to ambulate. She has significant lower extremity lymphedema which limits her ambulation.   Family says her condition has deteriorated the past several days.  Past Medical History  Diagnosis Date  . GERD (gastroesophageal reflux disease)   . Hypothyroidism   . Bipolar 1 disorder   . Obesity   . Anemia   . Pulmonary nodule   . Tremors of nervous system   . Memory loss   . Urinary incontinence   . Choking   . Cellulitis 07/07/2012  . Lymphedema 01/08/2013  . Foot deformity, bilateral 01/08/2013   Past Surgical History  Procedure Laterality Date  . D&c x2     Family History  Problem Relation Age of Onset  . Stroke Mother   . Hypertension Mother   . Asthma Daughter   . Arthritis Daughter    History  Substance Use Topics  . Smoking status: Never Smoker   . Smokeless tobacco: Not on file  . Alcohol Use: No   OB History   Grav Para Term Preterm Abortions TAB SAB Ect Mult Living                 Review of Systems  Unable to perform ROS: Dementia      Allergies  Amoxicillin; Nitrofurantoin; Nitrofurantoin monohyd macro; Omeprazole-sodium bicarbonate; Sulfa antibiotics; and Sulfasalazine  Home Medications   Current Outpatient Rx  Name  Route  Sig  Dispense  Refill  . aspirin EC 81 MG tablet   Oral   Take 81 mg by mouth every morning.          . Boswellia-Glucosamine-Vit D (GLUCOSAMINE COMPLEX PO)   Oral   Take 1 tablet by mouth every morning.          . donepezil (ARICEPT) 10 MG tablet   Oral   Take 10 mg by mouth every morning.          .  ferrous sulfate 324 (65 FE) MG TBEC   Oral   Take 1 tablet by mouth every morning.          . fish oil-omega-3 fatty acids 1000 MG capsule   Oral   Take 1,000 mg by mouth every morning.          . fluticasone (FLONASE) 50 MCG/ACT nasal spray   Nasal   Place 2 sprays into the nose daily.   16 g   6   . levothyroxine (SYNTHROID, LEVOTHROID) 75 MCG tablet      take 1 tablet by mouth once daily before BREAKFAST   30 tablet   2   . lithium carbonate (ESKALITH) 450 MG CR tablet   Oral   Take 450 mg by mouth at bedtime.          . Multiple Vitamin (MULTIVITAMIN WITH MINERALS) TABS   Oral   Take 1 tablet by mouth daily.         . primidone (MYSOLINE) 50 MG tablet   Oral   Take 150 mg by mouth every morning.          . traZODone (DESYREL) 50 MG tablet  Oral   Take 50 mg by mouth at bedtime.         Marland Kitchen. venlafaxine XR (EFFEXOR-XR) 75 MG 24 hr capsule   Oral   Take 75 mg by mouth every morning.           BP 145/65  Pulse 82  Temp(Src) 98.3 F (36.8 C) (Rectal)  Resp 18  SpO2 100% Physical Exam  Nursing note and vitals reviewed. Constitutional:  Extremely dry mucous membranes, answers questions slowly  HENT:  Head: Normocephalic and atraumatic.  Eyes: Conjunctivae and EOM are normal. Pupils are equal, round, and reactive to light.  Neck: Normal range of motion. Neck supple.  Cardiovascular: Normal rate, regular rhythm and normal heart sounds.   Pulmonary/Chest: Effort normal and breath sounds normal.  Abdominal: Soft. Bowel sounds are normal.  Musculoskeletal: Normal range of motion.  unable  Neurological:  unable  Skin:  4+ lower extremity lymphedema  Psychiatric:  Flat affect    ED Course  Procedures (including critical care time) Labs Review Labs Reviewed  CBC - Abnormal; Notable for the following:    WBC 10.9 (*)    RBC 3.44 (*)    Hemoglobin 9.6 (*)    HCT 30.2 (*)    All other components within normal limits  COMPREHENSIVE METABOLIC  PANEL - Abnormal; Notable for the following:    Glucose, Bld 118 (*)    Calcium 10.8 (*)    Albumin 2.7 (*)    Total Bilirubin <0.2 (*)    GFR calc non Af Amer 70 (*)    GFR calc Af Amer 81 (*)    All other components within normal limits  CBG MONITORING, ED - Abnormal; Notable for the following:    Glucose-Capillary 127 (*)    All other components within normal limits  URINALYSIS, ROUTINE W REFLEX MICROSCOPIC  CBG MONITORING, ED   Imaging Review Dg Chest Portable 1 View  05/09/2013   CLINICAL DATA:  Lower extremity swelling.  EXAM: PORTABLE CHEST - 1 VIEW  COMPARISON:  06/11/2012  FINDINGS: The lung volumes leading to some crowding of the bronchovascular structures. Allowing for this, the lungs are clear. No pleural effusion or pneumothorax. The cardiac silhouette is normal in size. Mediastinum is normal in contour.  The bony thorax is diffusely demineralized but grossly intact.  IMPRESSION: No acute cardiopulmonary disease.   Electronically Signed   By: Amie Portlandavid  Ormond M.D.   On: 05/09/2013 20:15     EKG Interpretation None      MDM   Final diagnoses:  Dehydration  Failure to thrive  Lymphedema    Patient is clinically dehydrated although her blood pressure and pulse are normal.  Family says she has been failing.   Urinalysis pending. Admit to general medicine.    Donnetta HutchingBrian Dominie Benedick, MD 05/09/13 [phone]

## 2013-05-09 NOTE — ED Notes (Signed)
Bed: [id number] Expected date: 05/09/13 Expected time: 6:44 PM Means of arrival: Ambulance Comments: Hyperglycemia, HTN, AMS

## 2013-05-09 NOTE — H&P (Signed)
PCP:   Danise Edge, MD    Chief Complaint:  Frequent falls and hallucinations, confusion  HPI: [name] is a 79 y.o. 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   Presented with  At her baseline patient is able to walk with a walker but for the past 2 days she has increased generalized weakness and confusion. Family denies fever but she has been cold recently. She has hx of essential tremor that has worsened. Patient has been hallucinating and speaking to people who are not present family denies change in medications. This has been an acute change over past 2 days. Patient had hx of TIA in the past and family is concerned if she had a TIA.  Patient is no longer able to stand up with a walker and not able to use it appropriately. Family is concerned that she is unstable on her feet. Hospitalist was called on admission  Review of Systems:    Pertinent positives include: chills, generalized weakness.   Constitutional:  No weight loss, night sweats, Fevers,  fatigue, weight loss  HEENT:  No headaches, Difficulty swallowing,Tooth/dental problems,Sore throat,  No sneezing, itching, ear ache, nasal congestion, post nasal drip,  Cardio-vascular:  No chest pain, Orthopnea, PND, anasarca, dizziness, palpitations.no Bilateral lower extremity swelling  GI:  No heartburn, indigestion, abdominal pain, nausea, vomiting, diarrhea, change in bowel habits, loss of appetite, melena, blood in stool, hematemesis Resp:  no shortness of breath at rest. No dyspnea on exertion, No excess mucus, no productive cough, No non-productive cough, No coughing up of blood.No change in color of mucus.No wheezing. Skin:  no rash or lesions. No  jaundice GU:  no dysuria, change in color of urine, no urgency or frequency. No straining to urinate.  No flank pain.  Musculoskeletal:  No joint pain or no joint swelling. No decreased range of motion. No back pain.  Psych:  No change in mood or affect. No depression or anxiety. No memory loss.  Neuro: no localizing neurological complaints, no tingling, no weakness, no double vision, no gait abnormality, no slurred speech, no confusion  Otherwise ROS are negative except for above, 10 systems were reviewed  Past Medical History: Past Medical History  Diagnosis Date  . GERD (gastroesophageal reflux disease)   . Hypothyroidism   . Bipolar 1 disorder   . Obesity   . Anemia   . Pulmonary nodule   . Tremors of nervous system   . Memory loss   . Urinary incontinence   . Choking   . Cellulitis 07/07/2012  . Lymphedema 01/08/2013  . Foot deformity, bilateral 01/08/2013   Past Surgical History  Procedure Laterality Date  . D&c x2       Medications: Prior to Admission medications   Medication Sig Start Date End Date Taking? Authorizing Provider  aspirin EC 81 MG tablet Take 81 mg by mouth every morning.    Yes Historical Provider, MD  Boswellia-Glucosamine-Vit D (GLUCOSAMINE COMPLEX PO) Take 1 tablet by mouth every morning.    Yes Historical Provider, MD  donepezil (ARICEPT) 10 MG tablet Take 10 mg by mouth every morning.    Yes Historical Provider, MD  ferrous sulfate 324 (65 FE) MG TBEC Take 1 tablet by mouth every morning.    Yes Historical Provider, MD  fish oil-omega-3 fatty  acids 1000 MG capsule Take 1,000 mg by mouth every morning.    Yes Historical Provider, MD  fluticasone (FLONASE) 50 MCG/ACT nasal spray Place 2 sprays into the nose daily. 10/07/12  Yes Bradd Canary, MD  levothyroxine (SYNTHROID, LEVOTHROID) 75 MCG tablet take 1 tablet by mouth once daily before BREAKFAST 02/16/13  Yes Bradd Canary, MD  lithium carbonate (ESKALITH) 450 MG CR tablet Take 450 mg by mouth  at bedtime.    Yes Historical Provider, MD  Multiple Vitamin (MULTIVITAMIN WITH MINERALS) TABS Take 1 tablet by mouth daily.   Yes Historical Provider, MD  primidone (MYSOLINE) 50 MG tablet Take 150 mg by mouth every morning.    Yes Historical Provider, MD  traZODone (DESYREL) 50 MG tablet Take 50 mg by mouth at bedtime.   Yes Historical Provider, MD  venlafaxine XR (EFFEXOR-XR) 75 MG 24 hr capsule Take 75 mg by mouth every morning.  12/23/11  Yes Historical Provider, MD    Allergies:   Allergies  Allergen Reactions  . Amoxicillin     REACTION: unknown  . Nitrofurantoin     REACTION: severe reaction, throat swelling  . Nitrofurantoin Monohyd Macro   . Omeprazole-Sodium Bicarbonate     REACTION: unknown  . Sulfa Antibiotics   . Sulfasalazine     REACTION: unknown    Social History:  Ambulatory  walker  Lives a home with family   reports that she has never smoked. She does not have any smokeless tobacco history on file. She reports that she does not drink alcohol or use illicit drugs.   Family History: family history includes Arthritis in her daughter; Asthma in her daughter; Hypertension in her mother; Stroke in her mother.    Physical Exam: Patient Vitals for the past 24 hrs:  BP Temp Temp src Pulse Resp SpO2  05/09/13 1902 145/65 mmHg 98.3 F (36.8 C) Rectal 82 18 100 %    1. General:  in No Acute distress 2. Psychological: Alert but not Oriented 3. Head/ENT:     Dry Mucous Membranes                          Head Non traumatic, neck supple                            Poor Dentition 4. SKIN:   decreased Skin turgor,  Skin clean Dry and intact no rash 5. Heart: Regular rate and rhythm no Murmur, Rub or gallop 6. Lungs: Clear to auscultation bilaterally, no wheezes or crackles   7. Abdomen: Soft, non-tender, Non distended 8. Lower extremities: no clubbing, cyanosis, extensive bilateral lymphedema 9. Neurologically Grossly intact, strength appears to be 5 out of 5 equal  grips bilaterally. Cranial 2 through 12 appear to be intact 10. MSK: Normal range of motion  body mass index is unknown because there is no weight on file.   Labs on Admission:   Recent Labs  05/09/13 2010  NA 137  K 3.8  CL 100  CO2 25  GLUCOSE 118*  BUN 21  CREATININE 0.80  CALCIUM 10.8*    Recent Labs  05/09/13 2010  AST 21  ALT 25  ALKPHOS 77  BILITOT <0.2*  PROT 6.6  ALBUMIN 2.7*   No results found for this basename: LIPASE, AMYLASE,  in the last 72 hours  Recent Labs  05/09/13 2010  WBC 10.9*  HGB 9.6*  HCT 30.2*  MCV 87.8  PLT 353   No results found for this basename: CKTOTAL, CKMB, CKMBINDEX, TROPONINI,  in the last 72 hours No results found for this basename: TSH, T4TOTAL, FREET3, T3FREE, THYROIDAB,  in the last 72 hours No results found for this basename: VITAMINB12, FOLATE, FERRITIN, TIBC, IRON, RETICCTPCT,  in the last 72 hours Lab Results  Component Value Date   HGBA1C  Value: 6.0 (NOTE)   The ADA recommends the following therapeutic goals for glycemic   control related to Hgb A1C measurement:   Goal of Therapy:   < 7.0% Hgb A1C   Action Suggested:  > 8.0% Hgb A1C   Ref:  Diabetes Care, 22, Suppl. 1, 1999 11/25/2006    The CrCl is unknown because both a height and weight (above a minimum accepted value) are required for this calculation. ABG    Component Value Date/Time   PHART 7.358 11/25/2006 1105   HCO3 26.0* 11/25/2006 1105   TCO2 27 06/11/2012 1529   ACIDBASEDEF 4.0* 11/25/2006 0748   O2SAT 95.0 11/25/2006 1105     Lab Results  Component Value Date   DDIMER  Value: 3.60        AT THE INHOUSE ESTABLISHED CUTOFF VALUE OF 0.48 ug/mL FEU, THIS ASSAY HAS BEEN DOCUMENTED IN THE LITERATURE TO HAVE* 11/25/2006      Cultures:    Component Value Date/Time   SDES URINE, CLEAN CATCH 06/11/2012 1633   SPECREQUEST NONE 06/11/2012 1633   CULT NO GROWTH 06/11/2012 1633   REPTSTATUS 06/12/2012 FINAL 06/11/2012 1633       Radiological Exams on  Admission: Dg Chest Portable 1 View  05/09/2013   CLINICAL DATA:  Lower extremity swelling.  EXAM: PORTABLE CHEST - 1 VIEW  COMPARISON:  06/11/2012  FINDINGS: The lung volumes leading to some crowding of the bronchovascular structures. Allowing for this, the lungs are clear. No pleural effusion or pneumothorax. The cardiac silhouette is normal in size. Mediastinum is normal in contour.  The bony thorax is diffusely demineralized but grossly intact.  IMPRESSION: No acute cardiopulmonary disease.   Electronically Signed   By: Amie Portlandavid  Ormond M.D.   On: 05/09/2013 20:15    Chart has been reviewed  Assessment/Plan 79 year old female history of lymphedema and dementia here with  Dehydration, debility and encephalopathy likely secondary to dehydration  Present on Admission:   . Dehydration - patient's family is unsure how this occurred she apparently had good by mouth intake. Patient clinically appears to be dehydrated. They dry mucous membranes. We'll administer a fluids and monitor her state  . Lymphedema - this is chronic patient undergoing treatment for visit.patient  . HYPOTHYROIDISM - continue Synthroid  . Encephalopathy acute - no focal neurological deficits at this point CT scan have been unremarkable. Will see if this improves after administration of IV fluids. We'll evaluate for evidence of infection. UA is currently pending. Has not improved despite correction of metabolic abnormalities would consider further workup Anemia -  obtain anemia panel, check B12, folate  Prophylaxis: SCD  , Protonix  CODE STATUS:DNR/DNI  Other plan as per orders.  I have spent a total of 55 min on this admission  Harleigh Civello 05/09/2013, 11:10 PM

## 2013-05-09 NOTE — ED Notes (Signed)
EMS were phoned by her family who told EMS pt. Has "smelly urine and she's real weak".  She arrive here in no distress.  Bilat lower legs/feet/ankles are massively swollen and erythematous ?cellulitis?.  She is oriented to situation.  We received a markedly lower cbg than EMS (127).  She has hyperpigmented stasis skin changes of bilat. Lower extremities.  Upon turning her to perform rectal temp. We note a dime-size stage II pressure ulcer which is clean in appearance and has red granulation tissue.  Her family arrive shortly after her arrival.

## 2013-05-10 DIAGNOSIS — G9340 Encephalopathy, unspecified: Secondary | ICD-10-CM | POA: Diagnosis present

## 2013-05-10 LAB — URINALYSIS, ROUTINE W REFLEX MICROSCOPIC
Bilirubin Urine: NEGATIVE
Glucose, UA: NEGATIVE mg/dL
KETONES UR: NEGATIVE mg/dL
NITRITE: NEGATIVE
PH: 7 (ref 5.0–8.0)
Protein, ur: NEGATIVE mg/dL
Specific Gravity, Urine: 1.011 (ref 1.005–1.030)
Urobilinogen, UA: 0.2 mg/dL (ref 0.0–1.0)

## 2013-05-10 LAB — TROPONIN I
Troponin I: <0.3 ng/mL (ref <0.30)
Troponin I: <0.3 ng/mL (ref <0.30)

## 2013-05-10 LAB — COMPREHENSIVE METABOLIC PANEL
ALBUMIN: 2.4 g/dL — AB (ref 3.5–5.2)
ALK PHOS: 68 U/L (ref 39–117)
ALT: 23 U/L (ref 0–35)
AST: 23 U/L (ref 0–37)
BILIRUBIN TOTAL: 0.2 mg/dL — AB (ref 0.3–1.2)
BUN: 18 mg/dL (ref 6–23)
CHLORIDE: 107 meq/L (ref 96–112)
CO2: 24 mEq/L (ref 19–32)
Calcium: 10.1 mg/dL (ref 8.4–10.5)
Creatinine, Ser: 0.77 mg/dL (ref 0.50–1.10)
GFR calc Af Amer: >90 mL/min (ref >90)
GFR calc non Af Amer: 80 mL/min — ABNORMAL LOW (ref >90)
GLUCOSE: 109 mg/dL — AB (ref 70–99)
Potassium: 3.3 mEq/L — ABNORMAL LOW (ref 3.7–5.3)
SODIUM: 142 meq/L (ref 137–147)
Total Protein: 5.6 g/dL — ABNORMAL LOW (ref 6.0–8.3)

## 2013-05-10 LAB — CBC
HCT: 26.3 % — ABNORMAL LOW (ref 36.0–46.0)
Hemoglobin: 8.3 g/dL — ABNORMAL LOW (ref 12.0–15.0)
MCH: 27.5 pg (ref 26.0–34.0)
MCHC: 31.6 g/dL (ref 30.0–36.0)
MCV: 87.1 fL (ref 78.0–100.0)
PLATELETS: 323 10*3/uL (ref 150–400)
RBC: 3.02 MIL/uL — ABNORMAL LOW (ref 3.87–5.11)
RDW: 13.5 % (ref 11.5–15.5)
WBC: 8.5 10*3/uL (ref 4.0–10.5)

## 2013-05-10 LAB — MAGNESIUM: Magnesium: 1.5 mg/dL (ref 1.5–2.5)

## 2013-05-10 LAB — URINE MICROSCOPIC-ADD ON

## 2013-05-10 LAB — RETICULOCYTES
RBC.: 3.02 MIL/uL — ABNORMAL LOW (ref 3.87–5.11)
RETIC CT PCT: 1 % (ref 0.4–3.1)
Retic Count, Absolute: 30.2 10*3/uL (ref 19.0–186.0)

## 2013-05-10 LAB — IRON AND TIBC
Iron: 26 ug/dL — ABNORMAL LOW (ref 42–135)
SATURATION RATIOS: 15 % — AB (ref 20–55)
TIBC: 179 ug/dL — ABNORMAL LOW (ref 250–470)
UIBC: 153 ug/dL (ref 125–400)

## 2013-05-10 LAB — TSH: TSH: 2.025 u[IU]/mL (ref 0.350–4.500)

## 2013-05-10 LAB — PREALBUMIN: Prealbumin: 8.4 mg/dL — ABNORMAL LOW (ref 17.0–34.0)

## 2013-05-10 LAB — LITHIUM LEVEL: Lithium Lvl: 0.58 mEq/L — ABNORMAL LOW (ref 0.80–1.40)

## 2013-05-10 LAB — FOLATE: Folate: 17.6 ng/mL

## 2013-05-10 LAB — VITAMIN B12: Vitamin B-12: 190 pg/mL — ABNORMAL LOW (ref 211–911)

## 2013-05-10 LAB — PHOSPHORUS: PHOSPHORUS: 3.2 mg/dL (ref 2.3–4.6)

## 2013-05-10 LAB — FERRITIN: Ferritin: 70 ng/mL (ref 10–291)

## 2013-05-10 MED ORDER — ONDANSETRON HCL 4 MG/2ML IJ SOLN: 4.0000 mg | Freq: Four times a day (QID) | INTRAMUSCULAR | Status: DC | PRN

## 2013-05-10 MED ORDER — PRIMIDONE 50 MG PO TABS
150.0000 mg | ORAL_TABLET | Freq: Every morning | ORAL | Status: DC
Start: 2013-05-10 — End: 2013-05-12
  Administered 2013-05-11 – 2013-05-12 (×2): 150 mg via ORAL
  Filled 2013-05-10 (×3): qty 3

## 2013-05-10 MED ORDER — ACETAMINOPHEN 325 MG PO TABS: 650.0000 mg | ORAL_TABLET | Freq: Four times a day (QID) | ORAL | Status: DC | PRN

## 2013-05-10 MED ORDER — ACETAMINOPHEN 650 MG RE SUPP
650.0000 mg | Freq: Four times a day (QID) | RECTAL | Status: DC | PRN
  Administered 2013-05-10: 650 mg via RECTAL
  Filled 2013-05-10: qty 1

## 2013-05-10 MED ORDER — SODIUM CHLORIDE 0.9 % IV SOLN
INTRAVENOUS | Status: DC
  Administered 2013-05-10 – 2013-05-12 (×3): via INTRAVENOUS

## 2013-05-10 MED ORDER — SODIUM CHLORIDE 0.9 % IJ SOLN: 3.0000 mL | Freq: Two times a day (BID) | INTRAMUSCULAR | Status: DC

## 2013-05-10 MED ORDER — HYDROCODONE-ACETAMINOPHEN 5-325 MG PO TABS: 1.0000 | ORAL_TABLET | ORAL | Status: DC | PRN

## 2013-05-10 MED ORDER — DOCUSATE SODIUM 100 MG PO CAPS
100.0000 mg | ORAL_CAPSULE | Freq: Two times a day (BID) | ORAL | Status: DC
  Administered 2013-05-11 – 2013-05-12 (×3): 100 mg via ORAL
  Filled 2013-05-10 (×7): qty 1

## 2013-05-10 MED ORDER — ASPIRIN EC 81 MG PO TBEC
81.0000 mg | DELAYED_RELEASE_TABLET | Freq: Every morning | ORAL | Status: DC
  Administered 2013-05-11 – 2013-05-12 (×2): 81 mg via ORAL
  Filled 2013-05-10 (×3): qty 1

## 2013-05-10 MED ORDER — LEVOTHYROXINE SODIUM 75 MCG PO TABS
75.0000 ug | ORAL_TABLET | Freq: Every day | ORAL | Status: DC
  Administered 2013-05-11 – 2013-05-12 (×2): 75 ug via ORAL
  Filled 2013-05-10 (×4): qty 1

## 2013-05-10 MED ORDER — FLUTICASONE PROPIONATE 50 MCG/ACT NA SUSP
2.0000 | Freq: Every day | NASAL | Status: DC
  Administered 2013-05-11 – 2013-05-12 (×2): 2 via NASAL
  Filled 2013-05-10: qty 16

## 2013-05-10 MED ORDER — VENLAFAXINE HCL ER 75 MG PO CP24
75.0000 mg | ORAL_CAPSULE | Freq: Every morning | ORAL | Status: DC
  Administered 2013-05-11 – 2013-05-12 (×2): 75 mg via ORAL
  Filled 2013-05-10 (×3): qty 1

## 2013-05-10 MED ORDER — LITHIUM CARBONATE ER 450 MG PO TBCR
450.0000 mg | EXTENDED_RELEASE_TABLET | Freq: Every day | ORAL | Status: DC
  Administered 2013-05-11: 450 mg via ORAL
  Filled 2013-05-10 (×4): qty 1

## 2013-05-10 MED ORDER — SODIUM CHLORIDE 0.9 % IV SOLN
INTRAVENOUS | Status: AC
  Administered 2013-05-10: 02:00:00 via INTRAVENOUS

## 2013-05-10 MED ORDER — LORAZEPAM 2 MG/ML IJ SOLN
1.0000 mg | Freq: Once | INTRAMUSCULAR | Status: AC
  Administered 2013-05-10: 1 mg via INTRAVENOUS
  Filled 2013-05-10: qty 1

## 2013-05-10 MED ORDER — ONDANSETRON HCL 4 MG PO TABS: 4.0000 mg | ORAL_TABLET | Freq: Four times a day (QID) | ORAL | Status: DC | PRN

## 2013-05-10 MED ORDER — ENOXAPARIN SODIUM 40 MG/0.4ML ~~LOC~~ SOLN
40.0000 mg | SUBCUTANEOUS | Status: DC
  Administered 2013-05-11 – 2013-05-12 (×2): 40 mg via SUBCUTANEOUS
  Filled 2013-05-10 (×3): qty 0.4

## 2013-05-10 MED ORDER — DONEPEZIL HCL 10 MG PO TABS
10.0000 mg | ORAL_TABLET | Freq: Every morning | ORAL | Status: DC
  Administered 2013-05-11 – 2013-05-12 (×2): 10 mg via ORAL
  Filled 2013-05-10 (×3): qty 1

## 2013-05-10 MED ORDER — TRAZODONE HCL 50 MG PO TABS
50.0000 mg | ORAL_TABLET | Freq: Every day | ORAL | Status: DC
  Administered 2013-05-11: 50 mg via ORAL
  Filled 2013-05-10 (×4): qty 1

## 2013-05-10 MED ORDER — FERROUS SULFATE 325 (65 FE) MG PO TABS
325.0000 mg | ORAL_TABLET | Freq: Every day | ORAL | Status: DC
  Administered 2013-05-11 – 2013-05-12 (×2): 325 mg via ORAL
  Filled 2013-05-10 (×4): qty 1

## 2013-05-10 NOTE — Progress Notes (Signed)
Patient sedated on night shift.  Patient still very lethargic.  Responsive to sternal rub and does open eyes when asked.  Unable to administer medications or assess neurological status.  Will continue to monitor and update assessment as changes occur.  

## 2013-05-10 NOTE — Progress Notes (Signed)
Patient seen and examined. Database reviewed. Discussed with husband at bedside. Patient has become difficult to care for at home given her lymphedema and difficulty ambulating. She does have a UTI. Continue rocephin pending cx data. Will request SW evaluation for placement.  Peggye PittEstela Hernandez, MD [facility] Pager: [phone]

## 2013-05-11 DIAGNOSIS — N390 Urinary tract infection, site not specified: Secondary | ICD-10-CM

## 2013-05-11 DIAGNOSIS — R5381 Other malaise: Secondary | ICD-10-CM

## 2013-05-11 LAB — CBC
HEMATOCRIT: 26.7 % — AB (ref 36.0–46.0)
HEMOGLOBIN: 8.3 g/dL — AB (ref 12.0–15.0)
MCH: 27.1 pg (ref 26.0–34.0)
MCHC: 31.1 g/dL (ref 30.0–36.0)
MCV: 87.3 fL (ref 78.0–100.0)
Platelets: 297 10*3/uL (ref 150–400)
RBC: 3.06 MIL/uL — AB (ref 3.87–5.11)
RDW: 13.6 % (ref 11.5–15.5)
WBC: 14.4 10*3/uL — ABNORMAL HIGH (ref 4.0–10.5)

## 2013-05-11 LAB — BASIC METABOLIC PANEL
BUN: 17 mg/dL (ref 6–23)
CHLORIDE: 110 meq/L (ref 96–112)
CO2: 24 mEq/L (ref 19–32)
Calcium: 9.8 mg/dL (ref 8.4–10.5)
Creatinine, Ser: 0.67 mg/dL (ref 0.50–1.10)
GFR calc non Af Amer: 83 mL/min — ABNORMAL LOW (ref >90)
GLUCOSE: 93 mg/dL (ref 70–99)
POTASSIUM: 3.4 meq/L — AB (ref 3.7–5.3)
SODIUM: 144 meq/L (ref 137–147)

## 2013-05-11 LAB — URINE CULTURE
CULTURE: NO GROWTH
Colony Count: NO GROWTH
Special Requests: NORMAL

## 2013-05-11 MED ORDER — CIPROFLOXACIN HCL 250 MG PO TABS
250.0000 mg | ORAL_TABLET | Freq: Two times a day (BID) | ORAL | Status: DC
  Administered 2013-05-11 – 2013-05-12 (×2): 250 mg via ORAL
  Filled 2013-05-11 (×4): qty 1

## 2013-05-11 MED ORDER — ENSURE COMPLETE PO LIQD
237.0000 mL | Freq: Two times a day (BID) | ORAL | Status: DC
  Administered 2013-05-12: 237 mL via ORAL

## 2013-05-11 NOTE — Evaluation (Signed)
Physical Therapy Evaluation Patient Details Name: [name] MRN: [id number] DOB: 03-Jun-1934 Today's Date: 05/11/2013 Time: 1152-1209 PT Time Calculation (min): 17 min  PT Assessment / Plan / Recommendation History of Present Illness  79 year old female admitted for UTI and weakness with PMHx of  LE lymphedema, essential tremors, bil foot deformity and dementia.  At her baseline patient is able to walk with a walker but for the past 2 days she has increased generalized weakness and confusion. Family denies fever but she has been cold recently. She has hx of essential tremor that has worsened.  Clinical Impression  Pt currently with functional limitations due to the deficits listed below (see PT Problem List).  Pt will benefit from skilled PT to increase their independence and safety with mobility to allow discharge to the venue listed below.  Pt with dementia and unable to state PLOF however assisted to sitting EOB today with cues.     PT Assessment  Patient needs continued PT services    Follow Up Recommendations  SNF    Does the patient have the potential to tolerate intense rehabilitation      Barriers to Discharge        Equipment Recommendations  None recommended by PT (to be assessed if d/c home)    Recommendations for Other Services     Frequency Min 3X/week    Precautions / Restrictions Precautions Precautions: Fall Precaution Comments: lymphedema BLE   Pertinent Vitals/Pain No pain at rest, repositioned to comfort      Mobility  Bed Mobility Overal bed mobility: Needs Assistance Bed Mobility: Supine to Sit;Sit to Supine Supine to sit: Max assist Sit to supine: Max assist General bed mobility comments: verbal and tactile cues to assist with LEs over EOB, pt also required assist with scooting hips to EOB, assist for lower body upon return to supine, total assist to scoot up in bed in trendelenberg and using bed pad Transfers General transfer comment: did not  stand today, would need +2 for safety, pt also felt unable to stand    Exercises     PT Diagnosis: Generalized weakness;Difficulty walking  PT Problem List: Decreased strength;Decreased mobility;Decreased balance;Decreased knowledge of use of DME PT Treatment Interventions: DME instruction;Gait training;Functional mobility training;Therapeutic activities;Therapeutic exercise;Patient/family education;Neuromuscular re-education;Balance training     PT Goals(Current goals can be found in the care plan section) Acute Rehab PT Goals Patient Stated Goal: did not state PT Goal Formulation: With patient Time For Goal Achievement: 05/25/13 Potential to Achieve Goals: Good  Visit Information  Last PT Received On: 05/11/13 Assistance Needed: +2 Reason Eval/Treat Not Completed: Fatigue/lethargy limiting ability to participate History of Present Illness: 79 year old female admitted for UTI and weakness with PMHx of  LE lymphedema, essential tremors, bil foot deformity and dementia.  At her baseline patient is able to walk with a walker but for the past 2 days she has increased generalized weakness and confusion. Family denies fever but she has been cold recently. She has hx of essential tremor that has worsened.       Prior Functioning  Home Living Family/patient expects to be discharged to:: Skilled nursing facility Living Arrangements: Spouse/significant other Prior Function Level of Independence: Needs assistance Gait / Transfers Assistance Needed: per chart review, was ambulating with RW at baseline however recently needing more assist Communication Communication: No difficulties    Cognition  Cognition Arousal/Alertness: Awake/alert Behavior During Therapy: WFL for tasks assessed/performed Overall Cognitive Status: History of cognitive impairments -  at baseline (confused, at baseline per RN)    Extremity/Trunk Assessment Upper Extremity Assessment Upper Extremity Assessment:  Generalized weakness Lower Extremity Assessment Lower Extremity Assessment: Generalized weakness (motion limited by lymphedema)   Balance Balance Overall balance assessment: Needs assistance Sitting-balance support: Feet supported Sitting balance-Leahy Scale: Fair Sitting balance - Comments: pt sat EOB for a few minutes, PT and RN encouraged pt to drink, required trunk support while taking and holding cup however able to sit with supervision   End of Session PT - End of Session Activity Tolerance: Patient tolerated treatment well Patient left: in bed;with bed alarm set;with call bell/phone within reach Nurse Communication: Mobility status  GP     [place],KATHrine E 05/11/2013, 12:38 PM Zenovia JarredKati Yarnell Arvidson, PT, DPT 05/11/2013 Pager: [phone]

## 2013-05-11 NOTE — Progress Notes (Signed)
Clinical Social Work Department BRIEF PSYCHOSOCIAL ASSESSMENT 05/11/2013  Patient:  Lawson RadarPPEL,Karstyn R     Account Number:  [id number]     Admit date:  05/09/2013  Clinical Social Worker:  Hattie PerchORCORAN,Dequon Schnebly, LCSW  Date/Time:  05/11/2013 12:00 M  Referred by:  Physician  Date Referred:  05/11/2013 Referred for  SNF Placement   Other Referral:   Interview type:  Family Other interview type:    PSYCHOSOCIAL DATA Living Status:  FAMILY Admitted from facility:   Level of care:   Primary support name:  Rene KocherMichael Hara Primary support relationship to patient:  SPOUSE Degree of support available:   fair    CURRENT CONCERNS Current Concerns  Post-Acute Placement   Other Concerns:    SOCIAL WORK ASSESSMENT / PLAN CSW went to meet with patient. patient is sleeping and currently difficult to understand and not able to participate in assessment per RN. CSW called patients husband. He states that Dr Ardyth Harpshernandez did speak with him about patient going to snf and he does agree that she needs it. He is agreeable to patient being faxed out and recieving bed offers.   Assessment/plan status:   Other assessment/ plan:   Information/referral to community resources:    PATIENT'S/FAMILY'S RESPONSE TO PLAN OF CARE: spouse is realistic in his expectations of patient's needs. he is hopeful short term snf will return patient to her baseline.

## 2013-05-11 NOTE — Care Management Note (Addendum)
    Page 1 of 1   05/12/2013     1:10:50 PM   CARE MANAGEMENT NOTE 05/12/2013  Patient:  Lawson RadarPPEL,Libby R   Account Number:  [id number]  Date Initiated:  05/11/2013  Documentation initiated by:  [facility] Antonio[facility]HABIR,Camillia Marcy  Subjective/Objective Assessment:   79 Y/O F ADMITTED W/FALLS, HALLUCINATIONS, CONFUSION.ZO:XWRU,EAVWUJWJXBJHX:GERD,LYMPHAEDEMA.     Action/Plan:   FROM HOME.   Anticipated DC Date:  05/12/2013   Anticipated DC Plan:  SKILLED NURSING FACILITY      DC Planning Services  CM consult      Choice offered to / List presented to:             Status of service:  Completed, signed off Medicare Important Message given?   (If response is "NO", the following Medicare IM given date fields will be blank) Date Medicare IM given:   Date Additional Medicare IM given:    Discharge Disposition:  SKILLED NURSING FACILITY  Per UR Regulation:  Reviewed for med. necessity/level of care/duration of stay  If discussed at Long Length of Stay Meetings, dates discussed:    Comments:  05/12/13 Zarinah Oviatt RN,BSN NCM [phone] D/C SNF.  05/11/13 Aamirah Salmi RN,BSN NCM [phone] AWAIT PT/OT RECOMMENDATIONS.

## 2013-05-11 NOTE — Progress Notes (Signed)
INITIAL NUTRITION ASSESSMENT  DOCUMENTATION CODES Per approved criteria  -Not Applicable   INTERVENTION: -Recommend Ensure Complete po BID, each supplement provides 350 kcal and 13 grams of protein -Will continue to monitor  NUTRITION DIAGNOSIS: Increased nutrient needs (protein/kcal) related to increased demand for nutrients as evidenced by stage 2 pressure ulcer.   Goal: Pt to meet >/= 90% of their estimated nutrition needs    Monitor:  Total protein/energy intake, skin integrity, labs, weights  Reason for Assessment: Consult/MST/[place]  79 y.o. female  Admitting Dx: <principal problem not specified>  ASSESSMENT: [name] is a 79 y.o. 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 Presented with At her baseline patient is able to walk with a walker but for the past 2 days she has increased generalized weakness and confusion. Family denies fever but she has been cold recently. She has hx of essential tremor that has worsened. Patient has been hallucinating and speaking to people who are not present family denies change in medications. This has been an acute change over past 2 days. Patient had hx of TIA in the past and family is concerned if she had a TIA.    -MD expressed concern for pt's nutritional status d/t clinical signs of dehydration and debility upon admit. Family had reported good PO intake pta. -Per discussion with Nurse Tech, pt has been refusing meals even after several attempts. Has left juices bedside to encourage PO intake and appetite. -Pt with dementia and not able to communicate additional food/nutrition related hx -Pt with dime-sized stage 2 pressure ulcer on sacrum. Would benefit from  supplementation to assist in meeting est nutrition needs for wound healing/skin integrity -Pt's husband takes care of pt at home per MD notes.  -Weight has been declining per previous medical records. Has lost 7 lbs within 6 months  Nutrition Focused Physical Exam:  Subcutaneous Fat:  Orbital Region: WNL Upper Arm Region: WNL Thoracic and Lumbar Region: WNL  Muscle:  Temple Region: WNL Clavicle Bone Region: WNL Clavicle and Acromion Bone Region: WNL Scapular Bone Region: WNL Dorsal Hand: WNL Patellar Region: WNL Anterior Thigh Region: WNL Posterior Calf Region: WNL  Edema: bilateral lower legs/feet/ankles severe    Height: Ht Readings from Last 1 Encounters:  05/10/13 5' (1.524 m)    Weight: Wt Readings from Last 1 Encounters:  05/10/13 150 lb 2.1 oz (68.1 kg)    Ideal Body Weight: 100 lbs  % Ideal Body Weight: 150%  Wt Readings from Last 10 Encounters:  05/10/13 150 lb 2.1 oz (68.1 kg)  01/08/13 157 lb 1.3 oz (71.251 kg)  10/07/12 162 lb 1.3 oz (73.519 kg)  07/29/12 166 lb 0.6 oz (75.315 kg)  07/07/12 168 lb 4 oz (76.318 kg)  06/23/12 167 lb (75.751 kg)  03/10/12 189 lb (85.73 kg)  01/30/12 193 lb 9.6 oz (87.816 kg)  12/24/11 195 lb (88.451 kg)  10/19/11 194 lb (87.998 kg)    Usual Body Weight: unable to determine  % Usual Body Weight: unable to determine  BMI:  Body mass index is 29.32 kg/(m^2).Overweight  Estimated Nutritional Needs: Kcal: 1450-1650 Protein: 80-90 gram Fluid: >/=1600 ml/daily  Skin: Cellulitis/stage 2 pressure ulcer on sacrum  Diet Order: General  EDUCATION NEEDS: -No education needs identified at this time   Intake/Output Summary (Last 24 hours) at 05/11/13  1022 Last data filed at 05/11/13 0946  Gross per 24 hour  Intake 561.25 ml  Output   1975 ml  Net -1413.75 ml    Last BM: none during admit   Labs:   Recent Labs Lab 05/09/13 2010 05/10/13 0743 05/11/13 0444  NA 137 142 144  K 3.8 3.3* 3.4*  CL 100 107  110  CO2 25 24 24   BUN 21 18 17   CREATININE 0.80 0.77 0.67  CALCIUM 10.8* 10.1 9.8  MG  --  1.5  --   PHOS  --  3.2  --   GLUCOSE 118* 109* 93    CBG (last 3)   Recent Labs  05/09/13 1857  GLUCAP 127*    Scheduled Meds: . aspirin EC  81 mg Oral q morning - 10a  . docusate sodium  100 mg Oral BID  . donepezil  10 mg Oral q morning - 10a  . enoxaparin (LOVENOX) injection  40 mg Subcutaneous Q24H  . ferrous sulfate  325 mg Oral QPC breakfast  . fluticasone  2 spray Each Nare Daily  . levothyroxine  75 mcg Oral QAC breakfast  . lithium carbonate  450 mg Oral QHS  . primidone  150 mg Oral q morning - 10a  . sodium chloride  3 mL Intravenous Q12H  . traZODone  50 mg Oral QHS  . venlafaxine XR  75 mg Oral q morning - 10a    Continuous Infusions: . sodium chloride 75 mL/hr at 05/11/13 0230    Past Medical History  Diagnosis Date  . GERD (gastroesophageal reflux disease)   . Hypothyroidism   . Bipolar 1 disorder   . Obesity   . Anemia   . Pulmonary nodule   . Tremors of nervous system   . Memory loss   . Urinary incontinence   . Choking   . Cellulitis 07/07/2012  . Lymphedema 01/08/2013  . Foot deformity, bilateral 01/08/2013    Past Surgical History  Procedure Laterality Date  . D&c x2      April HugerSarah F Madaleine Simmon MS RD LDN Clinical Dietitian [phone]

## 2013-05-11 NOTE — Progress Notes (Signed)
[facility] PROGRESS NOTE  April RadarSandra R [name] RUE:[id number] DOB: 1934/08/22 DOA: 05/09/2013 PCP: Danise EdgeBLYTH, STACEY, MD  Assessment/Plan: Deconditioning/FTT -Has chronic lymphaedema which makes it difficult for her to ambulate at baseline on top of acute dehydration and potentially a UTI. -PT/OT recs SNF. Husband is no longer able to care for her at home.  ?UTI -Urine cx with no growth. -Because UA was so dirty will elect to treat with cipro for 5 days.  Dehydration -Continue IVF.  Acute Encephalopathy -Much improved today with IVF and antibiotics.  Code Status: DNR Family Communication: Patient only  Disposition Plan: SNF   Consultants:  None   Antibiotics:  Cipro   Subjective: Feels weak, but better  Objective: Filed Vitals:   05/10/13 1520 05/11/13 0035 05/11/13 0559 05/11/13 1458  BP: 136/83 129/77 152/62 126/69  Pulse: 86 69 76 81  Temp: 100.1 F (37.8 C) 96.6 F (35.9 C) 98.3 F (36.8 C) 98.2 F (36.8 C)  TempSrc: Axillary Axillary Oral Oral  Resp: 20 20 20 18   Height:      Weight:      SpO2: 95% 98% 96% 96%    Intake/Output Summary (Last 24 hours) at 05/11/13 1653 Last data filed at 05/11/13 1500  Gross per 24 hour  Intake 621.25 ml  Output   1725 ml  Net -1103.75 ml   Filed Weights   05/10/13 0100  Weight: 68.1 kg (150 lb 2.1 oz)    Exam:   General:  awake  Cardiovascular: RRR  Respiratory: CTA B  Abdomen: S/NT/ND/+BS  Extremities: lymphedema   Neurologic:  Moves all 4  Data Reviewed: Basic Metabolic Panel:  Recent Labs Lab 05/09/13 2010 05/10/13 0743 05/11/13 0444  NA 137 142 144  K 3.8 3.3* 3.4*  CL 100 107 110  CO2 25 24 24   GLUCOSE 118* 109* 93  BUN 21 18 17   CREATININE 0.80 0.77 0.67  CALCIUM 10.8* 10.1 9.8  MG  --  1.5  --   PHOS  --  3.2  --    Liver Function Tests:  Recent Labs Lab 05/09/13 2010 05/10/13 0743  AST 21 23  ALT 25 23  ALKPHOS 77 68  BILITOT <0.2* 0.2*  PROT 6.6 5.6*  ALBUMIN 2.7*  2.4*   No results found for this basename: LIPASE, AMYLASE,  in the last 168 hours No results found for this basename: AMMONIA,  in the last 168 hours CBC:  Recent Labs Lab 05/09/13 2010 05/10/13 0743 05/11/13 0444  WBC 10.9* 8.5 14.4*  HGB 9.6* 8.3* 8.3*  HCT 30.2* 26.3* 26.7*  MCV 87.8 87.1 87.3  PLT 353 323 297   Cardiac Enzymes:  Recent Labs Lab 05/10/13 0224 05/10/13 0743 05/10/13 1404  TROPONINI <0.30 <0.30 <0.30   BNP (last 3 results) No results found for this basename: PROBNP,  in the last 8760 hours CBG:  Recent Labs Lab 05/09/13 1857  GLUCAP 127*    Recent Results (from the past 240 hour(s))  URINE CULTURE     Status: None   Collection Time    05/10/13  4:00 AM      Result Value Ref Range Status   Specimen Description URINE, CATHETERIZED   Final   Special Requests Normal   Final   Culture  Setup Time     Final   Value: 05/10/2013 16:08     Performed at [facility]   Colony Count     Final   Value: NO GROWTH  Performed at [facility]     Final   Value: NO GROWTH     Performed at [employer] [facility]   Report Status 05/11/2013 FINAL   Final     Studies: Ct Head Wo Contrast  05/10/2013   CLINICAL DATA:  Weakness, early dementia  EXAM: CT HEAD WITHOUT CONTRAST  TECHNIQUE: Contiguous axial images were obtained from the base of the skull through the vertex without intravenous contrast.  COMPARISON:  06/11/2012  FINDINGS: No evidence of parenchymal hemorrhage or extra-axial fluid collection. No mass lesion, mass effect, or midline shift.  No CT evidence of acute infarction.  Subcortical white matter and periventricular small vessel ischemic changes.  Mild global cortical atrophy.  Mucosal thickening in the bilateral ethmoid sinuses. Partial opacification/fluid level in the right maxillary sinus, chronic. Mastoid air cells are clear.  No evidence of calvarial fracture.  IMPRESSION: No evidence of acute intracranial abnormality.   Atrophy with small vessel ischemic changes.   Electronically Signed   By: Charline Bills M.D.   On: 05/10/2013 00:10   Dg Chest Portable 1 View  05/09/2013   CLINICAL DATA:  Lower extremity swelling.  EXAM: PORTABLE CHEST - 1 VIEW  COMPARISON:  06/11/2012  FINDINGS: The lung volumes leading to some crowding of the bronchovascular structures. Allowing for this, the lungs are clear. No pleural effusion or pneumothorax. The cardiac silhouette is normal in size. Mediastinum is normal in contour.  The bony thorax is diffusely demineralized but grossly intact.  IMPRESSION: No acute cardiopulmonary disease.   Electronically Signed   By: Amie Portland M.D.   On: 05/09/2013 20:15    Scheduled Meds: . aspirin EC  81 mg Oral q morning - 10a  . docusate sodium  100 mg Oral BID  . donepezil  10 mg Oral q morning - 10a  . enoxaparin (LOVENOX) injection  40 mg Subcutaneous Q24H  . feeding supplement (ENSURE COMPLETE)  237 mL Oral BID WC  . ferrous sulfate  325 mg Oral QPC breakfast  . fluticasone  2 spray Each Nare Daily  . levothyroxine  75 mcg Oral QAC breakfast  . lithium carbonate  450 mg Oral QHS  . primidone  150 mg Oral q morning - 10a  . sodium chloride  3 mL Intravenous Q12H  . traZODone  50 mg Oral QHS  . venlafaxine XR  75 mg Oral q morning - 10a   Continuous Infusions: . sodium chloride 75 mL/hr at 05/11/13 0230    Active Problems:   HYPOTHYROIDISM   LEG EDEMA   Lymphedema   Dehydration   Encephalopathy acute   UTI (urinary tract infection)   Physical deconditioning    Time spent: 25 minutes. Greater than 50% of this time was spent in direct contact with the patient coordinating care.    Chaya Jan  [facility] Pager [phone]  If 7PM-7AM, please contact night-coverage at [email or web address], password [facility] 05/11/2013, 4:53 PM  LOS: 2 days

## 2013-05-11 NOTE — Evaluation (Signed)
Clinical/Bedside Swallow Evaluation Patient Details  Name: [name] MRN: [id number] Date of Birth: 07-Jul-1934  Today's Date: 05/11/2013 Time: [id number] SLP Time Calculation (min): 45 min  Past Medical History:  Past Medical History  Diagnosis Date  . GERD (gastroesophageal reflux disease)   . Hypothyroidism   . Bipolar 1 disorder   . Obesity   . Anemia   . Pulmonary nodule   . Tremors of nervous system   . Memory loss   . Urinary incontinence   . Choking   . Cellulitis 07/07/2012  . Lymphedema 01/08/2013  . Foot deformity, bilateral 01/08/2013   Past Surgical History:  Past Surgical History  Procedure Laterality Date  . D&c x2     HPI:  79 [facility] with leg edema, lymphadema, ? UTI, deconditioning, weight loss, encephalopathy- has h/o bipolar dx.  Per RN, pt with very poor dental care when she arrived - Bedside swallow evaluation ordered.    Assessment / Plan / Recommendation Clinical Impression  Pt with functional oropharyngeal swallow ability based on clinical swallow evaluation - No Cranial nerve deficits nor clinical indications of compromised airway protection.    Pt with slow but effective mastication of solids.    Suspect pt with decreased initiation due to cognition impacting her intake - note weight loss of 7 pounds in six months.  Spouse arrived late in session and reports pt eats well at home and is able to feed herself.    Advised pt/spouse to precautions to compensate for decreased intake including sweet flavor being last intact. Oral care importance reviewed with pt/spouse.  Suspect intermittent supervision may encourage pt to consume more po.    Recommend continue regular/thin diet to allow pt's RN/family to order food pt will manage.  Pt does not recall informing her nurse earlier of her occasional choking with liquids-likely due to dementia.  All education completed and SlP will sign off.     Aspiration Risk  Mild    Diet Recommendation  Regular;Thin liquid   Medication Administration: Whole meds with liquid Supervision: Intermittent supervision to cue for compensatory strategies Compensations: Slow rate;Small sips/bites Postural Changes and/or Swallow Maneuvers: Seated upright 90 degrees;Upright 30-60 min after meal    Other  Recommendations Oral Care Recommendations: Oral care BID   Follow Up Recommendations  None    Frequency and Duration   n/a     Pertinent Vitals/Pain Afebrile, decreased      Swallow Study Prior Functional Status   see hhx     General Date of Onset: 05/11/13 HPI: 79 [facility] edema, lymphadema, ? UTI, deconditioning, weight loss, encephalopathy- has h/o bipolar dx.  Per RN, pt with very poor dental care when she arrived - Bedside swallow evaluation ordered.  Type of Study: Bedside swallow evaluation Previous Swallow Assessment: none Diet Prior to this Study: Regular;Thin liquids Temperature Spikes Noted: No Respiratory Status: Room air Behavior/Cognition: Alert;Cooperative;Pleasant mood Oral Cavity - Dentition: Adequate natural dentition (pt would benefit from improved self oral care- dentition in poor condition) Self-Feeding Abilities: Able to feed self (spouse states pt feeds herself at home, needs spoon for peas, rice per spouse) Patient Positioning: Upright in bed Baseline Vocal Quality: Clear Volitional Swallow: Unable to elicit    Oral/Motor/Sensory Function Overall Oral Motor/Sensory Function: Appears within functional limits for tasks assessed (no focal cn deficits)   Ice Chips Ice chips: Not tested   Thin Liquid Thin Liquid: Within functional limits Presentation: Self Fed;Straw  Nectar Thick Nectar Thick Liquid: Not tested   Honey Thick Honey Thick Liquid: Not tested   Puree Puree: Within functional limits Presentation: Spoon   Solid   GO    Other Comments: slow but effective mastication, no significant oral residuals noted,  suspect cognitive  deficit impacting pt's intake       Donavan Burnet, MS [facility] SLP [phone]

## 2013-05-11 NOTE — Progress Notes (Signed)
PT Cancellation Note  Patient Details Name: April RadarSandra R [name] MRN: [id number] DOB: 1935-02-28   Cancelled Treatment:    Reason Eval/Treat Not Completed: Fatigue/lethargy limiting ability to participate Pt sleeping upon entering room and awakened to touch however not keeping eyes open, shakes head yes to being tired with eyes closed.  Will check back as schedule permits.   Diasha Castleman,KATHrine E 05/11/2013, 9:37 AM Zenovia JarredKati Yaa Donnellan, PT, DPT 05/11/2013 Pager: [phone]

## 2013-05-11 NOTE — Evaluation (Signed)
Occupational Therapy Evaluation Patient Details Name: [name] MRN: [id number] DOB: November 13, 1934 Today's Date: 05/11/2013 Time: 7829-5621 OT Time Calculation (min): 38 min  OT Assessment / Plan / Recommendation History of present illness Pt has a past medical history of GERD (gastroesophageal reflux disease); Hypothyroidism; Bipolar 1 disorder; Obesity; Anemia; Pulmonary nodule; Tremors of nervous system; Memory loss; Urinary incontinence; Choking; Cellulitis (07/07/2012); Lymphedema (01/08/2013); and Foot deformity, bilateral (01/08/2013).     Clinical Impression   Pt presents to OT with decreased I with ADL activity due to problems listed below.  Pt will benefit from skilled OT to increase I with ADL activity and return to PLOF    OT Assessment  Patient needs continued OT Services    Follow Up Recommendations  SNF    Barriers to Discharge Decreased caregiver support    Equipment Recommendations  None recommended by OT       Frequency  Min 2X/week    Precautions / Restrictions Precautions Precautions: Fall Precaution Comments: lymphedema BLE       ADL  Grooming: Wash/dry face;Minimal assistance Where Assessed - Grooming: Unsupported sitting Upper Body Bathing: Moderate assistance Where Assessed - Upper Body Bathing: Unsupported sitting Lower Body Bathing: +1 Total assistance Where Assessed - Lower Body Bathing: Unsupported sitting Upper Body Dressing: Moderate assistance Where Assessed - Upper Body Dressing: Unsupported sitting Lower Body Dressing: Maximal assistance Where Assessed - Lower Body Dressing: Unsupported sitting ADL Comments: Pt sat EOB for approx 20 min.  Pt did well with sitting balance.  Pt BLE very swollen.  Needed significant A getting legs off bed and back on bed. Pt declined standing with OT     OT Problem List: Decreased strength;Decreased activity tolerance;Impaired balance (sitting and/or standing) OT Treatment Interventions: Self-care/ADL  training;Patient/family education;DME and/or AE instruction   OT Goals(Current goals can be found in the care plan section) Acute Rehab OT Goals Patient Stated Goal: did not state OT Goal Formulation: With patient Time For Goal Achievement: 05/25/13 Potential to Achieve Goals: Good  Visit Information  Last OT Received On: 05/11/13 Assistance Needed: +2 History of Present Illness: Pt has a past medical history of GERD (gastroesophageal reflux disease); Hypothyroidism; Bipolar 1 disorder; Obesity; Anemia; Pulmonary nodule; Tremors of nervous system; Memory loss; Urinary incontinence; Choking; Cellulitis (07/07/2012); Lymphedema (01/08/2013); and Foot deformity, bilateral (01/08/2013).         Prior Functioning     Home Living Family/patient expects to be discharged to:: Skilled nursing facility Living Arrangements: Spouse/significant other Prior Function Level of Independence: Needs assistance Gait / Transfers Assistance Needed: per chart review, was ambulating with RW at baseline however recently needing more assist Communication Communication: No difficulties         Vision/Perception Vision - History Patient Visual Report: No change from baseline   Cognition  Cognition Arousal/Alertness: Awake/alert Behavior During Therapy: WFL for tasks assessed/performed Overall Cognitive Status: History of cognitive impairments - at baseline (confused, at baseline per RN)    Extremity/Trunk Assessment Upper Extremity Assessment Upper Extremity Assessment: Generalized weakness Lower Extremity Assessment Lower Extremity Assessment: Generalized weakness (motion limited by lymphedema)     Mobility Bed Mobility Overal bed mobility: Needs Assistance (Simultaneous filing. User may not have seen previous data.) Bed Mobility: Supine to Sit;Sit to Supine Supine to sit: Max assist Sit to supine: Max assist Transfers General transfer comment: OT did not stand pt at this session            End of Session OT - End of Session Activity  Tolerance: Patient limited by fatigue Patient left: in bed;with call bell/phone within reach;with family/visitor present Nurse Communication: Mobility status  GO     [name], [name] 05/11/2013, 12:27 PM

## 2013-05-12 LAB — BASIC METABOLIC PANEL
BUN: 15 mg/dL (ref 6–23)
CO2: 24 mEq/L (ref 19–32)
Calcium: 10 mg/dL (ref 8.4–10.5)
Chloride: 108 mEq/L (ref 96–112)
Creatinine, Ser: 0.71 mg/dL (ref 0.50–1.10)
GFR calc Af Amer: >90 mL/min (ref >90)
GFR, EST NON AFRICAN AMERICAN: 82 mL/min — AB (ref >90)
GLUCOSE: 106 mg/dL — AB (ref 70–99)
POTASSIUM: 3.4 meq/L — AB (ref 3.7–5.3)
Sodium: 140 mEq/L (ref 137–147)

## 2013-05-12 LAB — CBC
HEMATOCRIT: 26.3 % — AB (ref 36.0–46.0)
Hemoglobin: 8.4 g/dL — ABNORMAL LOW (ref 12.0–15.0)
MCH: 27.6 pg (ref 26.0–34.0)
MCHC: 31.9 g/dL (ref 30.0–36.0)
MCV: 86.5 fL (ref 78.0–100.0)
Platelets: 338 10*3/uL (ref 150–400)
RBC: 3.04 MIL/uL — AB (ref 3.87–5.11)
RDW: 13.6 % (ref 11.5–15.5)
WBC: 10.4 10*3/uL (ref 4.0–10.5)

## 2013-05-12 MED ORDER — CIPROFLOXACIN HCL 250 MG PO TABS: 250.0000 mg | ORAL_TABLET | Freq: Two times a day (BID) | ORAL | Status: DC

## 2013-05-12 NOTE — Progress Notes (Signed)
Provided bed offers to both patients husband and daughter. They will decide on facility today.  Bjorn Hallas C. Tationa Stech MSW, LCSW [phone]

## 2013-05-12 NOTE — Progress Notes (Addendum)
Patient assessment has not changed from am. Patient is stable at discharge. [facility] [place].

## 2013-05-12 NOTE — Progress Notes (Addendum)
CSW met with patient and spouse at bedside. [facility] Patient is cleared for discharge. Packet copied and placed in [place]. ptar called for transportation.  Keven Soucy C. [place] MSW, [place]

## 2013-05-12 NOTE — Clinical Social Work Placement (Signed)
     Clinical Social Work Department CLINICAL SOCIAL WORK PLACEMENT NOTE 05/12/2013  Patient:  [name],[name]  Account Number:  [id number] Admit date:  05/09/2013  Clinical Social Worker:  Becky SaxBETHANY Jonathon Tan, LCSW  Date/time:  05/12/2013 12:00 M  Clinical Social Work is seeking post-discharge placement for this patient at the following level of care:   SKILLED NURSING   (*CSW will update this form in Epic as items are completed)   05/12/2013  Patient/family provided with [facility] Department of Clinical Social Works list of facilities offering this level of care within the geographic area requested by the patient (or if unable, by the patients family).  05/12/2013  Patient/family informed of their freedom to choose among providers that offer the needed level of care, that participate in Medicare, Medicaid or managed care program needed by the patient, have an available bed and are willing to accept the patient.  05/12/2013  Patient/family informed of [facility] ownership interest in [name], as well as of the fact that they are under no obligation to receive care at this facility.  PASARR submitted to EDS on 05/12/2013 PASARR number received from EDS on 05/12/2013  FL2 transmitted to all facilities in geographic area requested by pt/family on  05/12/2013 FL2 transmitted to all facilities within larger geographic area on   Patient informed that his/her managed care company has contracts with or will negotiate with  certain facilities, including the following:     Patient/family informed of bed offers received:  05/12/2013 [name] Physician recommends and patient chooses bed at    [name]  05/12/2013 Patient to be transferred to facility by ptar  The following physician request were entered in Epic:   Additional Comments:

## 2013-05-12 NOTE — Discharge Summary (Signed)
Physician Discharge Summary  April RadarSandra R Robley ZOX:[id number] DOB: 23-Feb-1935 DOA: 05/09/2013  PCP: Danise EdgeBLYTH, STACEY, MD  Admit date: 05/09/2013 Discharge date: 05/12/2013  Time spent: 45 minutes  Recommendations for Outpatient Follow-up:  -Will be discharged to SNF today.   Discharge Diagnoses:  Active Problems:   HYPOTHYROIDISM   LEG EDEMA   Lymphedema   Dehydration   Encephalopathy acute   UTI (urinary tract infection)   Physical deconditioning   Discharge Condition: Stable  Filed Weights   05/10/13 0100  Weight: 68.1 kg (150 lb 2.1 oz)    History of present illness:  [name] is a 79 y.o. female  has a past medical history of GERD (gastroesophageal reflux disease); Hypothyroidism; Bipolar 1 disorder; Obesity; Anemia; Pulmonary nodule; Tremors of nervous system; Memory loss; Urinary incontinence; Choking; Cellulitis (07/07/2012); Lymphedema (01/08/2013); and Foot deformity, bilateral (01/08/2013).  Presented with  At her baseline patient is able to walk with a walker but for the past 2 days she has increased generalized weakness and confusion. Family denies fever but she has been cold recently. She has hx of essential tremor that has worsened.  Patient has been hallucinating and speaking to people who are not present family denies change in medications. This has been an acute change over past 2 days. Patient had hx of TIA in the past and family is concerned if she had a TIA.  Patient is no longer able to stand up with a walker and not able to use it appropriately. Family is concerned that she is unstable on her feet. Hospitalist was called on admission   Hospital Course:   Deconditioning/FTT  -Has chronic lymphaedema which makes it difficult for her to ambulate at baseline on top of acute dehydration and potentially a UTI.  -PT/OT recs SNF. Husband is no longer able to care for her at home.   ?UTI  -Urine cx with no growth.  -Because UA was so dirty will elect to treat  with cipro for 5 days. 4 days remaining on DC.  Dehydration  -Resolved with IVF.   Acute Encephalopathy  -Much improved  with IVF and antibiotics. -Per husband she is close to her baseline.   Procedures:  None   Consultations:  None  Discharge Instructions  Discharge Orders   Future Orders Complete By Expires   Diet - low sodium heart healthy  As directed    Discontinue IV  As directed    Increase activity slowly  As directed        Medication List    STOP taking these medications       GLUCOSAMINE COMPLEX PO      TAKE these medications       aspirin EC 81 MG tablet  Take 81 mg by mouth every morning.     ciprofloxacin 250 MG tablet  Commonly known as:  CIPRO  Take 1 tablet (250 mg total) by mouth 2 (two) times daily. For 4 days     donepezil 10 MG tablet  Commonly known as:  ARICEPT  Take 10 mg by mouth every morning.     ferrous sulfate 324 (65 FE) MG Tbec  Take 1 tablet by mouth every morning.     fish oil-omega-3 fatty acids 1000 MG capsule  Take 1,000 mg by mouth every morning.     fluticasone 50 MCG/ACT nasal spray  Commonly known as:  FLONASE  Place 2 sprays into the nose daily.     levothyroxine 75 MCG tablet  Commonly  known as:  SYNTHROID, LEVOTHROID  take 1 tablet by mouth once daily before BREAKFAST     lithium carbonate 450 MG CR tablet  Commonly known as:  ESKALITH  Take 450 mg by mouth at bedtime.     multivitamin with minerals Tabs tablet  Take 1 tablet by mouth daily.     primidone 50 MG tablet  Commonly known as:  MYSOLINE  Take 150 mg by mouth every morning.     traZODone 50 MG tablet  Commonly known as:  DESYREL  Take 50 mg by mouth at bedtime.     venlafaxine XR 75 MG 24 hr capsule  Commonly known as:  EFFEXOR-XR  Take 75 mg by mouth every morning.       Allergies  Allergen Reactions  . Amoxicillin     REACTION: unknown  . Nitrofurantoin     REACTION: severe reaction, throat swelling  . Nitrofurantoin Monohyd  Macro   . Omeprazole-Sodium Bicarbonate     REACTION: unknown  . Sulfa Antibiotics   . Sulfasalazine     REACTION: unknown      The results of significant diagnostics from this hospitalization (including imaging, microbiology, ancillary and laboratory) are listed below for reference.    Significant Diagnostic Studies: Ct Head Wo Contrast  05/10/2013   CLINICAL DATA:  Weakness, early dementia  EXAM: CT HEAD WITHOUT CONTRAST  TECHNIQUE: Contiguous axial images were obtained from the base of the skull through the vertex without intravenous contrast.  COMPARISON:  06/11/2012  FINDINGS: No evidence of parenchymal hemorrhage or extra-axial fluid collection. No mass lesion, mass effect, or midline shift.  No CT evidence of acute infarction.  Subcortical white matter and periventricular small vessel ischemic changes.  Mild global cortical atrophy.  Mucosal thickening in the bilateral ethmoid sinuses. Partial opacification/fluid level in the right maxillary sinus, chronic. Mastoid air cells are clear.  No evidence of calvarial fracture.  IMPRESSION: No evidence of acute intracranial abnormality.  Atrophy with small vessel ischemic changes.   Electronically Signed   By: Charline Bills M.D.   On: 05/10/2013 00:10   Dg Chest Portable 1 View  05/09/2013   CLINICAL DATA:  Lower extremity swelling.  EXAM: PORTABLE CHEST - 1 VIEW  COMPARISON:  06/11/2012  FINDINGS: The lung volumes leading to some crowding of the bronchovascular structures. Allowing for this, the lungs are clear. No pleural effusion or pneumothorax. The cardiac silhouette is normal in size. Mediastinum is normal in contour.  The bony thorax is diffusely demineralized but grossly intact.  IMPRESSION: No acute cardiopulmonary disease.   Electronically Signed   By: Amie Portland M.D.   On: 05/09/2013 20:15    Microbiology: Recent Results (from the past 240 hour(s))  URINE CULTURE     Status: None   Collection Time    05/10/13  4:00 AM       Result Value Ref Range Status   Specimen Description URINE, CATHETERIZED   Final   Special Requests Normal   Final   Culture  Setup Time     Final   Value: 05/10/2013 16:08     Performed at [facility] Count     Final   Value: NO GROWTH     Performed at [facility]   Culture     Final   Value: NO GROWTH     Performed at [facility]   Report Status 05/11/2013 FINAL   Final     Labs: Basic  Metabolic Panel:  Recent Labs Lab 05/09/13 2010 05/10/13 0743 05/11/13 0444 05/12/13 0525  NA 137 142 144 140  K 3.8 3.3* 3.4* 3.4*  CL 100 107 110 108  CO2 25 24 24 24   GLUCOSE 118* 109* 93 106*  BUN 21 18 17 15   CREATININE 0.80 0.77 0.67 0.71  CALCIUM 10.8* 10.1 9.8 10.0  MG  --  1.5  --   --   PHOS  --  3.2  --   --    Liver Function Tests:  Recent Labs Lab 05/09/13 2010 05/10/13 0743  AST 21 23  ALT 25 23  ALKPHOS 77 68  BILITOT <0.2* 0.2*  PROT 6.6 5.6*  ALBUMIN 2.7* 2.4*   No results found for this basename: LIPASE, AMYLASE,  in the last 168 hours No results found for this basename: AMMONIA,  in the last 168 hours CBC:  Recent Labs Lab 05/09/13 2010 05/10/13 0743 05/11/13 0444 05/12/13 0525  WBC 10.9* 8.5 14.4* 10.4  HGB 9.6* 8.3* 8.3* 8.4*  HCT 30.2* 26.3* 26.7* 26.3*  MCV 87.8 87.1 87.3 86.5  PLT 353 323 297 338   Cardiac Enzymes:  Recent Labs Lab 05/10/13 0224 05/10/13 0743 05/10/13 1404  TROPONINI <0.30 <0.30 <0.30   BNP: BNP (last 3 results) No results found for this basename: PROBNP,  in the last 8760 hours CBG:  Recent Labs Lab 05/09/13 1857  GLUCAP 127*       Signed:  Chaya Jan  [facility] Pager: [phone] 05/12/2013, 12:48 PM

## 2013-05-13 ENCOUNTER — Non-Acute Institutional Stay (SKILLED_NURSING_FACILITY): Payer: Medicare Other | Admitting: Internal Medicine

## 2013-05-13 DIAGNOSIS — M21969 Unspecified acquired deformity of unspecified lower leg: Secondary | ICD-10-CM

## 2013-05-13 DIAGNOSIS — E860 Dehydration: Secondary | ICD-10-CM

## 2013-05-13 DIAGNOSIS — I890 Lymphedema, not elsewhere classified: Secondary | ICD-10-CM

## 2013-05-13 DIAGNOSIS — M21961 Unspecified acquired deformity of right lower leg: Secondary | ICD-10-CM

## 2013-05-13 DIAGNOSIS — G9340 Encephalopathy, unspecified: Secondary | ICD-10-CM

## 2013-05-13 DIAGNOSIS — N390 Urinary tract infection, site not specified: Secondary | ICD-10-CM

## 2013-05-13 DIAGNOSIS — R5381 Other malaise: Secondary | ICD-10-CM

## 2013-05-13 DIAGNOSIS — M21962 Unspecified acquired deformity of left lower leg: Secondary | ICD-10-CM

## 2013-05-13 NOTE — Progress Notes (Signed)
Patient ID: [name], female   DOB: 19-Jan-1935, 79 y.o.   MRN: [id number]  Provider:  Gwenith Spitz. Renato Gails, D.O., C.M.D. Location:  [name]  PCP: Danise Edge, MD  Code Status: DNR   Allergies  Allergen Reactions  . Amoxicillin     REACTION: unknown  . Nitrofurantoin     REACTION: severe reaction, throat swelling  . Nitrofurantoin Monohyd Macro   . Omeprazole-Sodium Bicarbonate     REACTION: unknown  . Sulfa Antibiotics   . Sulfasalazine     REACTION: unknown    Chief Complaint  Patient presents with  . Hospitalization Follow-up    HPI: 79 y.o. female with h/o essential tremor, GERD, hypothyroidism, bipolar, TIA, obesity, anemia, memory loss, urinary incontinence, lymphedema, and bilateral foot deformity was admitted here for short term rehab s/p hospitalization for increased weakness and confusion for 2 days, worsened tremor, inability to walk with her walker and an URI.  She lives with her husband.  She was found to be generally deconditioned with failure to thrive, UTI treated with cipro for 5 days (4 more days at d/c), chronic lymphedema, and dehydration that resolved with IVFs.  Her mentation improved to near baseline per the discharge summary.  It appears her urine culture was negative.    ROS: Review of Systems  Constitutional: Negative for fever.  Eyes: Negative for blurred vision.  Respiratory: Negative for shortness of breath.   Cardiovascular: Positive for leg swelling. Negative for chest pain.  Gastrointestinal: Negative for constipation.  Genitourinary: Negative for dysuria, urgency and frequency.  Musculoskeletal: Positive for myalgias.  Skin: Negative for rash.  Neurological: Negative for loss of consciousness and headaches.  Psychiatric/Behavioral: Positive for memory loss. The patient does not have insomnia.      Past Medical History  Diagnosis Date  . GERD (gastroesophageal reflux disease)   . Hypothyroidism   . Bipolar 1 disorder    . Obesity   . Anemia   . Pulmonary nodule   . Tremors of nervous system   . Memory loss   . Urinary incontinence   . Choking   . Cellulitis 07/07/2012  . Lymphedema 01/08/2013  . Foot deformity, bilateral 01/08/2013   Past Surgical History  Procedure Laterality Date  . D&c x2     Social History:   reports that she has never smoked. She does not have any smokeless tobacco history on file. She reports that she does not drink alcohol or use illicit drugs.  Family History  Problem Relation Age of Onset  . Stroke Mother   . Hypertension Mother   . Asthma Daughter   . Arthritis Daughter     Medications: Patient's Medications  New Prescriptions   No medications on file  Previous Medications   ASPIRIN EC 81 MG TABLET    Take 81 mg by mouth every morning.    CIPROFLOXACIN (CIPRO) 250 MG TABLET    Take 1 tablet (250 mg total) by mouth 2 (two) times daily. For 4 days   DONEPEZIL (ARICEPT) 10 MG TABLET    Take 10 mg by mouth every morning.    FERROUS SULFATE 324 (65 FE) MG TBEC    Take 1 tablet by mouth every morning.    FISH OIL-OMEGA-3 FATTY ACIDS 1000 MG CAPSULE    Take 1,000 mg by mouth every morning.    FLUTICASONE (FLONASE) 50 MCG/ACT NASAL SPRAY    Place 2 sprays into the nose daily.   LEVOTHYROXINE (SYNTHROID, LEVOTHROID) 75 MCG TABLET  take 1 tablet by mouth once daily before BREAKFAST   LITHIUM CARBONATE (ESKALITH) 450 MG CR TABLET    Take 450 mg by mouth at bedtime.    MULTIPLE VITAMIN (MULTIVITAMIN WITH MINERALS) TABS    Take 1 tablet by mouth daily.   PRIMIDONE (MYSOLINE) 50 MG TABLET    Take 150 mg by mouth every morning.    TRAZODONE (DESYREL) 50 MG TABLET    Take 50 mg by mouth at bedtime.   VENLAFAXINE XR (EFFEXOR-XR) 75 MG 24 HR CAPSULE    Take 75 mg by mouth every morning.   Modified Medications   No medications on file  Discontinued Medications   No medications on file     Physical Exam:  Physical Exam  Vitals reviewed. Constitutional: No distress.    HENT:  Head: Normocephalic and atraumatic.  Right Ear: External ear normal.  Left Ear: External ear normal.  Nose: Nose normal.  Mouth/Throat: Oropharynx is clear and moist.  Eyes: EOM are normal. Pupils are equal, round, and reactive to light.  Neck: No JVD present.  Pulmonary/Chest: Effort normal and breath sounds normal. No respiratory distress.  Abdominal: Soft. Bowel sounds are normal. She exhibits no distension and no mass. There is no tenderness.  Musculoskeletal: Normal range of motion. She exhibits edema.  Neurological: She is alert.  Skin: Skin is warm and dry.  Psychiatric: She has a normal mood and affect.    Labs reviewed: Basic Metabolic Panel:  Recent Labs  16/12/9600/28/15 2010 05/10/13 0743 05/11/13 0444 05/12/13 0525  NA 137 142 144 140  K 3.8 3.3* 3.4* 3.4*  CL 100 107 110 108  CO2 25 24 24 24   GLUCOSE 118* 109* 93 106*  BUN 21 18 17 15   CREATININE 0.80 0.77 0.67 0.71  CALCIUM 10.8* 10.1 9.8 10.0  MG  --  1.5  --   --   PHOS  --  3.2  --   --    Liver Function Tests:  Recent Labs  05/09/13 2010 05/10/13 0743  AST 21 23  ALT 25 23  ALKPHOS 77 68  BILITOT <0.2* 0.2*  PROT 6.6 5.6*  ALBUMIN 2.7* 2.4*  CBC:  Recent Labs  05/10/13 0743 05/11/13 0444 05/12/13 0525  WBC 8.5 14.4* 10.4  HGB 8.3* 8.3* 8.4*  HCT 26.3* 26.7* 26.3*  MCV 87.1 87.3 86.5  PLT 323 297 338   Cardiac Enzymes:  Recent Labs  05/10/13 0224 05/10/13 0743 05/10/13 1404  TROPONINI <0.30 <0.30 <0.30  CBG:  Recent Labs  06/11/12 1504 05/09/13 1857  GLUCAP 92 127*    Imaging and Procedures: Ct Head Wo Contrast  05/10/2013  No evidence of acute intracranial abnormality. Atrophy with small vessel ischemic changes.   Dg Chest Portable 1 View  No acute cardiopulmonary disease.   Assessment/Plan 1. Encephalopathy acute -likely primarily due to dehydration and UTI -improved to near baseline -cont to monitor  2. UTI (urinary tract infection) -completing cipro  abx -encourage hydration due to dehydration she had with this, as well   3. Chronic acquired lymphedema -elevate feet at rest, may need unnaboot wraps  4. Foot deformity, bilateral -interferes with ambulation -here for PT, OT due to her deconditioning from UTI and dehydration  5. Dehydration -f/u cbc, bmp in 1 wk -encourage po fluids esp water  6. Physical deconditioning -continue PT, OT here with goal to return home  Functional status:  Due to weakness and foot deformity, requiring some help with bathing, dressing, transfers at  present  Family/ staff Communication: discussed with her nurse  Labs/tests ordered:  Cbc, bmp

## 2013-06-15 ENCOUNTER — Encounter: Payer: Self-pay | Admitting: Internal Medicine

## 2014-12-23 ENCOUNTER — Ambulatory Visit: Payer: Medicare Other | Admitting: Neurology

## 2014-12-29 ENCOUNTER — Encounter: Payer: Self-pay | Admitting: Neurology

## 2014-12-29 ENCOUNTER — Ambulatory Visit (INDEPENDENT_AMBULATORY_CARE_PROVIDER_SITE_OTHER): Payer: Medicare Other | Admitting: Neurology

## 2014-12-29 VITALS — BP 128/68 | HR 60

## 2014-12-29 DIAGNOSIS — Z79899 Other long term (current) drug therapy: Secondary | ICD-10-CM

## 2014-12-29 DIAGNOSIS — F0391 Unspecified dementia with behavioral disturbance: Secondary | ICD-10-CM | POA: Diagnosis not present

## 2014-12-29 MED ORDER — QUETIAPINE FUMARATE 25 MG PO TABS: ORAL_TABLET | ORAL | Status: AC

## 2014-12-29 MED ORDER — DIVALPROEX SODIUM 125 MG PO CSDR: 500.0000 mg | DELAYED_RELEASE_CAPSULE | Freq: Two times a day (BID) | ORAL | Status: DC

## 2014-12-29 MED ORDER — DIVALPROEX SODIUM 125 MG PO CSDR: 500.0000 mg | DELAYED_RELEASE_CAPSULE | Freq: Two times a day (BID) | ORAL | Status: AC

## 2014-12-29 NOTE — Progress Notes (Signed)
PATIENT: [name] DOB: 1934-08-12  Chief Complaint  Patient presents with  . NP referral Eula ListenHussain    ? Psuedo-bulbar diagnosis  Pt has had "hollerin episodes" for the last 2 mo, just recently it has gotten more frequent,  Husband has been able to speak to her and calm her down over the phone, but now not always.  She is living at [facility] for 1-1/2 yrs.       HISTORICAL  April RadarSandra R [name] she is a 80 year old right-handed female, accompanied by her husband, and her daughter Dois DavenportSandra Andrey Campanile(Sandy) in Dec 29 2014 for evaluation of worsening memory trouble behavior issues,   I have reviewed and summarized her nursing home record, she had a past medical history of bilateral lower extremity lymphedema, hypothyroidism, long-standing history of bipolar disorder, stroke in the past, dementia with behavior issues.   She had a long-standing history of bipolar disorder, went on disability because of that, was on polypharmacy treatment, around 2014, daughter noticed acute worsening of her mentation, become more forgetful, also developed behavior issues, she was admitted to hospital in February 2016 for increased confusion, generalized weakness, was not able to be cared at home, [facility]  She is currently on polypharmacy treatment, this includes aspirin 81 mg Aricept 10 mg daily, fish oil, Flonase spray, levothyroxine75 g daily, lithium 450 mg every night, multivitamin, iron tablet, Mysoline 50 mg 3 tablets mg daily for essential tremoror 75 mg daily.  She used to take Depakote 250 mg twice, a day, plus Depakote extended release 125 mg 3 times a day,  Ativan 0.5 milligrams every 12 hours as needed for agitations.  She had rapid decline over the past few months, yelling uncontrollably, could no longer carry on a conversation, agitated, need personal care, bathing, feeding, her husband is very dedicated to her care, but daughter Andrey CampanileSandy has Child psychotherapistmaster degree in  Financial tradersocial ethics, work at [employer] ethic committee.  Patient was also noted to have increased right arm swelling, less spontaneous movement of right arm.  I have personally reviewed CAT scan of brain in February 2015, generalized atrophy, moderate periventricular small vessel disease, left worse than right.   Patient herself was not cooperative on examinations, yelling most of the time during the interview.  REVIEW OF SYSTEMS: Full 14 system review of systems performed and notable only for memory loss, confusion, slurred speech, anxiety, depression   ALLERGIES: Allergies  Allergen Reactions  . Amoxicillin     REACTION: unknown  . Nitrofurantoin     REACTION: severe reaction, throat swelling  . Nitrofurantoin Monohyd Macro   . Omeprazole-Sodium Bicarbonate     REACTION: unknown  . Sulfa Antibiotics   . Sulfasalazine     REACTION: unknown    HOME MEDICATIONS: Current Outpatient Prescriptions  Medication Sig Dispense Refill  . aspirin EC 81 MG tablet Take 81 mg by mouth every morning.     . donepezil (ARICEPT) 10 MG tablet Take 10 mg by mouth every morning.     . ferrous sulfate 324 (65 FE) MG TBEC Take 1 tablet by mouth every morning.     . fish oil-omega-3 fatty acids 1000 MG capsule Take 1,000 mg by mouth every morning.     . fluticasone (FLONASE) 50 MCG/ACT nasal spray Place 2 sprays into the nose daily. 16 g 6  . levothyroxine (SYNTHROID, LEVOTHROID) 75 MCG tablet take 1 tablet by mouth once daily before BREAKFAST 30 tablet 2  . lithium carbonate (  ESKALITH) 450 MG CR tablet Take 450 mg by mouth at bedtime.     . Multiple Vitamin (MULTIVITAMIN WITH MINERALS) TABS Take 1 tablet by mouth daily.    . primidone (MYSOLINE) 50 MG tablet Take 150 mg by mouth every morning.     . traZODone (DESYREL) 50 MG tablet Take 50 mg by mouth at bedtime.    Marland Kitchen venlafaxine XR (EFFEXOR-XR) 75 MG 24 hr capsule Take 75 mg by mouth every morning.      No current facility-administered medications for this  visit.    PAST MEDICAL HISTORY: Past Medical History  Diagnosis Date  . GERD (gastroesophageal reflux disease)   . Hypothyroidism   . Bipolar 1 disorder (HCC)   . Obesity   . Anemia   . Pulmonary nodule   . Tremors of nervous system   . Memory loss     unspecified dementia with behavioral disturbance  . Urinary incontinence   . Choking   . Cellulitis 07/07/2012  . Lymphedema 01/08/2013    bilateral  . Foot deformity, bilateral 01/08/2013  . Major depressive disorder with single episode (HCC)   . Cognitive communication deficit   . Stroke [facility])     2 per husband report. (MINOR).    PAST SURGICAL HISTORY: Past Surgical History  Procedure Laterality Date  . D&c x2      FAMILY HISTORY: Family History  Problem Relation Age of Onset  . Stroke Mother   . Hypertension Mother   . Asthma Daughter   . Arthritis Daughter     SOCIAL HISTORY:  Social History   Social History  . Marital Status: Married    Spouse Name: N/A  . Number of Children: 2  . Years of Education: N/A   Occupational History  . Retired Diplomatic Services operational officer    Social History Main Topics  . Smoking status: Never Smoker   . Smokeless tobacco: Not on file  . Alcohol Use: No  . Drug Use: No  . Sexual Activity: Not on file   Other Topics Concern  . Not on file   Social History Narrative   Resident of [facility]         PHYSICAL EXAM   Filed Vitals:   12/29/14 1430  BP: 128/68  Pulse: 60    Not recorded      There is no weight on file to calculate BMI.  PHYSICAL EXAMNIATION:  Gen: NAD, conversant, well nourised, obese, well groomed                     Cardiovascular: Regular rate rhythm, no peripheral edema, warm, nontender. Eyes: Conjunctivae clear without exudates or hemorrhage Neck: Supple, no carotid bruise. Pulmonary: Clear to auscultation bilaterally   NEUROLOGICAL EXAM:  She sees in the wheelchair, yelling, not cooperative on examinations.     CRANIAL NERVES: Pupil are equal round reactive to light, She has normal extraocular movement, tongue was midline,   MOTOR: Limited spontaneous movement, most decreased in the right arm, significant spasticity of 4 limbs,  REFLEXES: Hyperreflexia, symmetric  SENSORY: Not cooperative  COORDINATION: Spot spontaneous movement, not cooperative on examinations   GAIT/STANCE: Deferred   DIAGNOSTIC DATA (LABS, IMAGING, TESTING) - I reviewed patient records, labs, notes, testing and imaging myself where available.   ASSESSMENT AND PLAN  KIEREN RICCI is a 80 y.o. female   Advanced dementia with behavior issues  History of extensive periventricular small vessel disease, new-onset right arm weakness, likely  indicating a new left hemisphere stroke.  Essential tremor, Polypharmacy treatment  was on primidone 50 mg 3 tablets each day  Tapering off primidone, stop aricept   Increase Depakote sprinkles  4 tabs bid.  Add on Seroquel  1-3 tabs qhs for agitation, ativan prn    Levert Feinstein, M.D. Ph.D.  [facility] [address], [address] [place], [place] [address] Ph: [phone] Fax: [phone]  CC: Georgann Housekeeper, MD

## 2014-12-31 ENCOUNTER — Telehealth: Payer: Self-pay | Admitting: Neurology

## 2014-12-31 NOTE — Telephone Encounter (Signed)
Husband called to request visit summary from office visit 12/29/14. Would like to pick this up on Monday around 12:00-12:30pm. Husband is aware Dr. Terrace ArabiaYan is out of the office today and will be back on Monday. Please call [phone] if questions.

## 2015-01-03 NOTE — Telephone Encounter (Signed)
We will need to process this request, appropriately, through medical records.

## 2015-05-25 NOTE — Telephone Encounter (Signed)
Done

## 2015-11-16 ENCOUNTER — Ambulatory Visit (HOSPITAL_BASED_OUTPATIENT_CLINIC_OR_DEPARTMENT_OTHER): Payer: Medicare Other

## 2015-12-11 DEATH — deceased
